# Patient Record
Sex: Female | Born: 1960 | Race: Black or African American | Hispanic: No | Marital: Single | Smoking: Never smoker
Health system: Southern US, Community
[De-identification: ages and names within clinical notes are randomized; demographics above are authoritative.]

## PROBLEM LIST (undated history)

## (undated) DIAGNOSIS — E119 Type 2 diabetes mellitus without complications: Secondary | ICD-10-CM

## (undated) DIAGNOSIS — I509 Heart failure, unspecified: Secondary | ICD-10-CM

## (undated) DIAGNOSIS — I639 Cerebral infarction, unspecified: Secondary | ICD-10-CM

## (undated) DIAGNOSIS — R569 Unspecified convulsions: Secondary | ICD-10-CM

## (undated) HISTORY — PX: NO PAST SURGERIES: SHX2092

---

## 2017-07-24 DIAGNOSIS — E119 Type 2 diabetes mellitus without complications: Secondary | ICD-10-CM

## 2017-07-24 HISTORY — DX: Type 2 diabetes mellitus without complications: E11.9

## 2017-08-17 ENCOUNTER — Encounter (HOSPITAL_COMMUNITY): Payer: Self-pay | Admitting: Emergency Medicine

## 2017-08-17 ENCOUNTER — Inpatient Hospital Stay (HOSPITAL_COMMUNITY)
Admission: EM | Admit: 2017-08-17 | Discharge: 2017-08-21 | DRG: 100 | Disposition: A | Discharge status: Home Health | Payer: 59 | Attending: Internal Medicine | Admitting: Internal Medicine

## 2017-08-17 DIAGNOSIS — G40901 Epilepsy, unspecified, not intractable, with status epilepticus: Principal | ICD-10-CM | POA: Diagnosis present

## 2017-08-17 DIAGNOSIS — Z7901 Long term (current) use of anticoagulants: Secondary | ICD-10-CM

## 2017-08-17 DIAGNOSIS — E1110 Type 2 diabetes mellitus with ketoacidosis without coma: Secondary | ICD-10-CM | POA: Diagnosis present

## 2017-08-17 DIAGNOSIS — E119 Type 2 diabetes mellitus without complications: Secondary | ICD-10-CM

## 2017-08-17 DIAGNOSIS — Z79899 Other long term (current) drug therapy: Secondary | ICD-10-CM

## 2017-08-17 DIAGNOSIS — Z8673 Personal history of transient ischemic attack (TIA), and cerebral infarction without residual deficits: Secondary | ICD-10-CM

## 2017-08-17 DIAGNOSIS — E1010 Type 1 diabetes mellitus with ketoacidosis without coma: Secondary | ICD-10-CM

## 2017-08-17 DIAGNOSIS — R4182 Altered mental status, unspecified: Secondary | ICD-10-CM | POA: Diagnosis not present

## 2017-08-17 DIAGNOSIS — G9389 Other specified disorders of brain: Secondary | ICD-10-CM | POA: Diagnosis present

## 2017-08-17 DIAGNOSIS — R569 Unspecified convulsions: Secondary | ICD-10-CM

## 2017-08-17 DIAGNOSIS — I4891 Unspecified atrial fibrillation: Secondary | ICD-10-CM | POA: Diagnosis present

## 2017-08-17 DIAGNOSIS — I509 Heart failure, unspecified: Secondary | ICD-10-CM | POA: Diagnosis present

## 2017-08-17 HISTORY — DX: Cerebral infarction, unspecified: I63.9

## 2017-08-17 HISTORY — DX: Unspecified convulsions: R56.9

## 2017-08-17 HISTORY — DX: Type 2 diabetes mellitus without complications: E11.9

## 2017-08-17 HISTORY — DX: Heart failure, unspecified: I50.9

## 2017-08-17 LAB — CBG MONITORING, ED: GLUCOSE-CAPILLARY: 497 mg/dL — AB (ref 65–99)

## 2017-08-17 MED ORDER — LORAZEPAM 2 MG/ML IJ SOLN
INTRAMUSCULAR | Status: AC
  Administered 2017-08-18: 2 mg
  Filled 2017-08-17: qty 1

## 2017-08-17 NOTE — ED Provider Notes (Signed)
[facility] EMERGENCY DEPARTMENT Provider Note   CSN: [id number] Arrival date & time: 08/17/17  2327     History   Chief Complaint Chief Complaint  Patient presents with  . Seizures   Level 5 caveat due to altered mental status HPI Denise Mcbride is a 57 y.o. female.  The history is provided by the patient and a relative. The history is limited by the condition of the patient.  Seizures   This is a new problem. Episode onset: Just prior to arrival. There were 2 to 3 seizures. The most recent episode lasted more than 5 minutes. The episode was witnessed. There has been no fever.  Patient presents for seizure.  She does not have a known history of seizures.  Patient was sitting in a chair and her family noticed that she began to have a generalized seizure. They did not give her medications Patient was found to be hyperglycemic. Patient is accompanied by her cousin.  They are traveling here from out of town.  History is limited from patient, his cousin reports she has difficulty speaking due to previous stroke.  She also appears to have postictal confusion  Patient has traveled here from [place] to see family  Past Medical History:  Diagnosis Date  . CHF (congestive heart failure) (HCC)   . Stroke (HCC)       OB History   None      Home Medications    Prior to Admission medications   Not on File    Family History No family history on file.  Social History Social History   Tobacco Use  . Smoking status: Never Smoker  . Smokeless tobacco: Never Used  Substance Use Topics  . Alcohol use: Never    Frequency: Never  . Drug use: Never     Allergies   Patient has no known allergies.   Review of Systems Review of Systems  Unable to perform ROS: Mental status change  Neurological: Positive for seizures.     Physical Exam Updated Vital Signs BP (!) 166/89 (BP Location: Right Arm)   Pulse (!) 108   Temp 98.1 F (36.7 C)    Resp 16   SpO2 99%   Physical Exam CONSTITUTIONAL: Well developed/well nourished HEAD: Normocephalic/atraumatic EYES: EOMI/PERRL ENMT: Mucous membranes moist NECK: supple no meningeal signs SPINE/BACK:entire spine nontender CV: S1/S2 noted, no murmurs/rubs/gallops noted LUNGS: Lungs are clear to auscultation bilaterally, no apparent distress ABDOMEN: soft, nontender GU:no cva tenderness NEURO: Pt is awake/alert, appears confused.  No obvious facial droop, no arm or leg drift EXTREMITIES: pulses normal/equal, full ROM SKIN: warm, color normal PSYCH: Unable to assess  ED Treatments / Results  Labs (all labs ordered are listed, but only abnormal results are displayed) Labs Reviewed  PROTIME-INR - Abnormal; Notable for the following components:      Result Value   Prothrombin Time 15.6 (*)    All other components within normal limits  CBC - Abnormal; Notable for the following components:   RBC 5.54 (*)    Hemoglobin 15.9 (*)    All other components within normal limits  COMPREHENSIVE METABOLIC PANEL - Abnormal; Notable for the following components:   Sodium 131 (*)    Chloride 95 (*)    CO2 18 (*)    Glucose, Bld 533 (*)    AST 51 (*)    Alkaline Phosphatase 151 (*)    Total Bilirubin 1.6 (*)    Anion gap 18 (*)  All other components within normal limits  URINALYSIS, ROUTINE W REFLEX MICROSCOPIC - Abnormal; Notable for the following components:   Color, Urine STRAW (*)    Glucose, UA >=500 (*)    Hgb urine dipstick SMALL (*)    Protein, ur 100 (*)    All other components within normal limits  CBG MONITORING, ED - Abnormal; Notable for the following components:   Glucose-Capillary 497 (*)    All other components within normal limits  CBG MONITORING, ED - Abnormal; Notable for the following components:   Glucose-Capillary 421 (*)    All other components within normal limits  ETHANOL  APTT  DIFFERENTIAL  RAPID URINE DRUG SCREEN, HOSP PERFORMED  I-STAT TROPONIN, ED      EKG EKG Interpretation  Date/Time:  Saturday Aug 17 2017 23:46:03 EDT Ventricular Rate:  101 PR Interval:    QRS Duration: 85 QT Interval:  381 QTC Calculation: 494 R Axis:   48 Text Interpretation:  Atrial fibrillation RSR' in V1 or V2, probably normal variant LVH with secondary repolarization abnormality Borderline prolonged QT interval No previous ECGs available Abnormal ekg Confirmed by Zadie Rhine ([id number]) on 08/17/2017 11:57:06 PM   Radiology Ct Head Wo Contrast  Result Date: 08/18/2017 CLINICAL DATA:  Seizure EXAM: CT HEAD WITHOUT CONTRAST TECHNIQUE: Contiguous axial images were obtained from the base of the skull through the vertex without intravenous contrast. COMPARISON:  None. FINDINGS: Brain: No acute territorial infarction, hemorrhage or intracranial mass. The ventricles are nonenlarged. Left frontal lobe encephalomalacia. Encephalomalacia at the insula Vascular: No hyperdense vessels. Scattered calcification at the carotid siphon Skull: Normal. Negative for fracture or focal lesion. Sinuses/Orbits: No acute finding. Other: None IMPRESSION: 1. No CT evidence for acute intracranial abnormality. 2. Left frontal lobe and insular encephalomalacia. Electronically Signed   By: Jasmine Pang M.D.   On: 08/18/2017 00:41    Procedures .Critical Care Performed by: Zadie Rhine, MD Authorized by: Zadie Rhine, MD   Critical care provider statement:    Critical care time (minutes):  80   Critical care start time:  08/18/2017 12:10 AM   Critical care end time:  08/18/2017 1:30 AM   Critical care was necessary to treat or prevent imminent or life-threatening deterioration of the following conditions:  CNS failure or compromise   Critical care was time spent personally by me on the following activities:  Development of treatment plan with patient or surrogate, discussions with consultants, evaluation of patient's response to treatment, examination of patient, obtaining  history from patient or surrogate, pulse oximetry, re-evaluation of patient's condition, ordering and review of radiographic studies, ordering and review of laboratory studies and ordering and performing treatments and interventions    Medications Ordered in ED Medications  insulin regular (NOVOLIN R,HUMULIN R) 100 Units in sodium chloride 0.9 % 100 mL (1 Units/mL) infusion (3.6 Units/hr Intravenous New Bag/Given 08/18/17 0131)  potassium chloride 10 mEq in 100 mL IVPB (10 mEq Intravenous New Bag/Given 08/18/17 0119)  LORazepam (ATIVAN) 2 MG/ML injection (2 mg  Given 08/18/17 0010)  levETIRAcetam (KEPPRA) IVPB 1000 mg/100 mL premix (0 mg Intravenous Stopped 08/18/17 0053)     Initial Impression / Assessment and Plan / ED Course  I have reviewed the triage vital signs and the nursing notes.  Pertinent labs & imaging results that were available during my care of the patient were reviewed by me and considered in my medical decision making (see chart for details).      This patients CHA2DS2-VASc Score and unadjusted  Ischemic Stroke Rate (% per year) is equal to 4.8 % stroke rate/year from a score of 4  Above score calculated as 1 point each if present [CHF, HTN, DM, Vascular=MI/PAD/Aortic Plaque, Age if 65-74, or Female] Above score calculated as 2 points each if present [Age > 75, or Stroke/TIA/TE]    12:00 AM This patient presents for new onset seizure.  She is now awake and alert but does appear confused.  History is limited as patient has postictal confusion, and she has no records here in [place] [facility] and labs pending at this time EKG does reveal atrial fibrillation, unknown time of onset and is unclear if patient knows she had this 12:10 AM  Called to room due to pt seizure It was generalized, per nursing, head was turned to the right Given ativan, now improving Keppra also ordered 1:50 AM Patient Is now resting comfortably, but easily arousable.  CT head results reviewed.   She is also found to be in mild DKA, but no reported history of diabetes Another family member at bedside confirms that she has no known history of seizures.  Problem list today includes new onset seizure, possibly new onset atrial fibrillation, and DKA. I discussed the case with Dr. Antionette Char with triad for admission I discussed the case with on-call neurologist.  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

## 2017-08-17 NOTE — ED Notes (Signed)
Patient transported to CT scan . 

## 2017-08-17 NOTE — ED Triage Notes (Signed)
Patient arrived with EMS seizure episode x2 this evening , CBG= 460 by EMS , family reported seizure while at a banquet this evening and at the ambulance , alert by confused at arrival .

## 2017-08-17 NOTE — ED Notes (Signed)
Checked CBG 497, RN Bobby informed

## 2017-08-18 ENCOUNTER — Inpatient Hospital Stay (HOSPITAL_COMMUNITY): Payer: 59

## 2017-08-18 ENCOUNTER — Encounter (HOSPITAL_COMMUNITY): Payer: Self-pay | Admitting: Family Medicine

## 2017-08-18 ENCOUNTER — Emergency Department (HOSPITAL_COMMUNITY): Payer: 59

## 2017-08-18 DIAGNOSIS — I509 Heart failure, unspecified: Secondary | ICD-10-CM

## 2017-08-18 DIAGNOSIS — Z8673 Personal history of transient ischemic attack (TIA), and cerebral infarction without residual deficits: Secondary | ICD-10-CM

## 2017-08-18 DIAGNOSIS — G9389 Other specified disorders of brain: Secondary | ICD-10-CM | POA: Diagnosis present

## 2017-08-18 DIAGNOSIS — Z7901 Long term (current) use of anticoagulants: Secondary | ICD-10-CM | POA: Diagnosis not present

## 2017-08-18 DIAGNOSIS — I4891 Unspecified atrial fibrillation: Secondary | ICD-10-CM | POA: Diagnosis present

## 2017-08-18 DIAGNOSIS — E1110 Type 2 diabetes mellitus with ketoacidosis without coma: Secondary | ICD-10-CM | POA: Diagnosis present

## 2017-08-18 DIAGNOSIS — E0811 Diabetes mellitus due to underlying condition with ketoacidosis with coma: Secondary | ICD-10-CM

## 2017-08-18 DIAGNOSIS — R569 Unspecified convulsions: Secondary | ICD-10-CM | POA: Diagnosis not present

## 2017-08-18 DIAGNOSIS — E119 Type 2 diabetes mellitus without complications: Secondary | ICD-10-CM

## 2017-08-18 DIAGNOSIS — Z79899 Other long term (current) drug therapy: Secondary | ICD-10-CM | POA: Diagnosis not present

## 2017-08-18 DIAGNOSIS — R4182 Altered mental status, unspecified: Secondary | ICD-10-CM | POA: Diagnosis present

## 2017-08-18 DIAGNOSIS — G40901 Epilepsy, unspecified, not intractable, with status epilepticus: Secondary | ICD-10-CM | POA: Diagnosis present

## 2017-08-18 LAB — HIV ANTIBODY (ROUTINE TESTING W REFLEX): HIV SCREEN 4TH GENERATION: NONREACTIVE

## 2017-08-18 LAB — URINALYSIS, ROUTINE W REFLEX MICROSCOPIC
BACTERIA UA: NONE SEEN
Bilirubin Urine: NEGATIVE
Glucose, UA: >=500 mg/dL — AB
Ketones, ur: NEGATIVE mg/dL
Leukocytes, UA: NEGATIVE
NITRITE: NEGATIVE
PH: 6 (ref 5.0–8.0)
Protein, ur: 100 mg/dL — AB
SPECIFIC GRAVITY, URINE: 1.024 (ref 1.005–1.030)

## 2017-08-18 LAB — CBC
HCT: 45.8 % (ref 36.0–46.0)
Hemoglobin: 15.9 g/dL — ABNORMAL HIGH (ref 12.0–15.0)
MCH: 28.7 pg (ref 26.0–34.0)
MCHC: 34.7 g/dL (ref 30.0–36.0)
MCV: 82.7 fL (ref 78.0–100.0)
PLATELETS: 182 10*3/uL (ref 150–400)
RBC: 5.54 MIL/uL — ABNORMAL HIGH (ref 3.87–5.11)
RDW: 12.2 % (ref 11.5–15.5)
WBC: 6 10*3/uL (ref 4.0–10.5)

## 2017-08-18 LAB — BASIC METABOLIC PANEL
ANION GAP: 8 (ref 5–15)
ANION GAP: 11 (ref 5–15)
ANION GAP: 12 (ref 5–15)
BUN: 9 mg/dL (ref 6–20)
BUN: 5 mg/dL — ABNORMAL LOW (ref 6–20)
BUN: 6 mg/dL (ref 6–20)
CALCIUM: 9.2 mg/dL (ref 8.9–10.3)
CALCIUM: 9.2 mg/dL (ref 8.9–10.3)
CALCIUM: 9 mg/dL (ref 8.9–10.3)
CO2: 24 mmol/L (ref 22–32)
CO2: 23 mmol/L (ref 22–32)
CO2: 20 mmol/L — ABNORMAL LOW (ref 22–32)
Chloride: 104 mmol/L (ref 101–111)
Chloride: 104 mmol/L (ref 101–111)
Chloride: 107 mmol/L (ref 101–111)
Creatinine, Ser: 0.59 mg/dL (ref 0.44–1.00)
Creatinine, Ser: 0.58 mg/dL (ref 0.44–1.00)
Creatinine, Ser: 0.59 mg/dL (ref 0.44–1.00)
GFR calc Af Amer: >60 mL/min (ref >60)
GFR calc Af Amer: >60 mL/min (ref >60)
GLUCOSE: 294 mg/dL — AB (ref 65–99)
Glucose, Bld: 245 mg/dL — ABNORMAL HIGH (ref 65–99)
Glucose, Bld: 213 mg/dL — ABNORMAL HIGH (ref 65–99)
Potassium: 3.2 mmol/L — ABNORMAL LOW (ref 3.5–5.1)
Potassium: 3 mmol/L — ABNORMAL LOW (ref 3.5–5.1)
Potassium: 3.7 mmol/L (ref 3.5–5.1)
SODIUM: 138 mmol/L (ref 135–145)
SODIUM: 139 mmol/L (ref 135–145)
Sodium: 136 mmol/L (ref 135–145)

## 2017-08-18 LAB — COMPREHENSIVE METABOLIC PANEL
ALBUMIN: 4.5 g/dL (ref 3.5–5.0)
ALK PHOS: 151 U/L — AB (ref 38–126)
ALT: 25 U/L (ref 14–54)
AST: 51 U/L — AB (ref 15–41)
Anion gap: 18 — ABNORMAL HIGH (ref 5–15)
BILIRUBIN TOTAL: 1.6 mg/dL — AB (ref 0.3–1.2)
BUN: 9 mg/dL (ref 6–20)
CALCIUM: 9.8 mg/dL (ref 8.9–10.3)
CO2: 18 mmol/L — AB (ref 22–32)
CREATININE: 0.88 mg/dL (ref 0.44–1.00)
Chloride: 95 mmol/L — ABNORMAL LOW (ref 101–111)
GFR calc Af Amer: >60 mL/min (ref >60)
GFR calc non Af Amer: >60 mL/min (ref >60)
GLUCOSE: 533 mg/dL — AB (ref 65–99)
Potassium: 3.6 mmol/L (ref 3.5–5.1)
SODIUM: 131 mmol/L — AB (ref 135–145)
Total Protein: 8 g/dL (ref 6.5–8.1)

## 2017-08-18 LAB — RAPID URINE DRUG SCREEN, HOSP PERFORMED
AMPHETAMINES: NOT DETECTED
BARBITURATES: NOT DETECTED
Benzodiazepines: NOT DETECTED
Cocaine: NOT DETECTED
OPIATES: NOT DETECTED
TETRAHYDROCANNABINOL: NOT DETECTED

## 2017-08-18 LAB — CBG MONITORING, ED
GLUCOSE-CAPILLARY: 256 mg/dL — AB (ref 65–99)
GLUCOSE-CAPILLARY: 90 mg/dL (ref 65–99)
GLUCOSE-CAPILLARY: 168 mg/dL — AB (ref 65–99)
GLUCOSE-CAPILLARY: 205 mg/dL — AB (ref 65–99)
Glucose-Capillary: 421 mg/dL — ABNORMAL HIGH (ref 65–99)
Glucose-Capillary: 319 mg/dL — ABNORMAL HIGH (ref 65–99)
Glucose-Capillary: 231 mg/dL — ABNORMAL HIGH (ref 65–99)
Glucose-Capillary: 129 mg/dL — ABNORMAL HIGH (ref 65–99)
Glucose-Capillary: 116 mg/dL — ABNORMAL HIGH (ref 65–99)
Glucose-Capillary: 230 mg/dL — ABNORMAL HIGH (ref 65–99)

## 2017-08-18 LAB — I-STAT TROPONIN, ED: Troponin i, poc: 0.02 ng/mL (ref 0.00–0.08)

## 2017-08-18 LAB — GLUCOSE, CAPILLARY
GLUCOSE-CAPILLARY: 115 mg/dL — AB (ref 65–99)
Glucose-Capillary: 266 mg/dL — ABNORMAL HIGH (ref 65–99)

## 2017-08-18 LAB — ETHANOL: Alcohol, Ethyl (B): <10 mg/dL (ref <10)

## 2017-08-18 LAB — PROTIME-INR
INR: 1.25
Prothrombin Time: 15.6 seconds — ABNORMAL HIGH (ref 11.4–15.2)

## 2017-08-18 LAB — HEMOGLOBIN A1C
Hgb A1c MFr Bld: 13.9 % — ABNORMAL HIGH (ref 4.8–5.6)
MEAN PLASMA GLUCOSE: 352.23 mg/dL

## 2017-08-18 LAB — DIFFERENTIAL
Abs Immature Granulocytes: 0 10*3/uL (ref 0.0–0.1)
BASOS ABS: 0 10*3/uL (ref 0.0–0.1)
BASOS PCT: 1 %
EOS ABS: 0.1 10*3/uL (ref 0.0–0.7)
EOS PCT: 2 %
IMMATURE GRANULOCYTES: 1 %
LYMPHS ABS: 2.2 10*3/uL (ref 0.7–4.0)
Lymphocytes Relative: 36 %
Monocytes Absolute: 0.6 10*3/uL (ref 0.1–1.0)
Monocytes Relative: 10 %
NEUTROS PCT: 50 %
Neutro Abs: 3.1 10*3/uL (ref 1.7–7.7)

## 2017-08-18 LAB — MAGNESIUM: Magnesium: 1.8 mg/dL (ref 1.7–2.4)

## 2017-08-18 LAB — APTT: aPTT: 29 seconds (ref 24–36)

## 2017-08-18 MED ORDER — FAMOTIDINE 20 MG PO TABS: 20.0000 mg | ORAL_TABLET | Freq: Two times a day (BID) | ORAL | Status: DC

## 2017-08-18 MED ORDER — CARVEDILOL 6.25 MG PO TABS
6.2500 mg | ORAL_TABLET | Freq: Two times a day (BID) | ORAL | Status: DC
  Administered 2017-08-18 – 2017-08-21 (×7): 6.25 mg via ORAL
  Filled 2017-08-18 (×7): qty 1

## 2017-08-18 MED ORDER — LEVETIRACETAM 500 MG PO TABS
500.0000 mg | ORAL_TABLET | Freq: Two times a day (BID) | ORAL | Status: DC
  Administered 2017-08-18 – 2017-08-21 (×6): 500 mg via ORAL
  Filled 2017-08-18 (×6): qty 1

## 2017-08-18 MED ORDER — LORAZEPAM 2 MG/ML IJ SOLN: 1.0000 mg | Freq: Four times a day (QID) | INTRAMUSCULAR | Status: DC | PRN

## 2017-08-18 MED ORDER — SODIUM CHLORIDE 0.9 % IV SOLN
INTRAVENOUS | Status: DC
  Administered 2017-08-18: 3.6 [IU]/h via INTRAVENOUS
  Filled 2017-08-18: qty 1

## 2017-08-18 MED ORDER — POTASSIUM CHLORIDE 10 MEQ/100ML IV SOLN
10.0000 meq | INTRAVENOUS | Status: AC
  Administered 2017-08-18 (×2): 10 meq via INTRAVENOUS
  Filled 2017-08-18: qty 100

## 2017-08-18 MED ORDER — FAMOTIDINE IN NACL 20-0.9 MG/50ML-% IV SOLN
20.0000 mg | Freq: Two times a day (BID) | INTRAVENOUS | Status: DC
  Administered 2017-08-18 – 2017-08-19 (×3): 20 mg via INTRAVENOUS
  Filled 2017-08-18 (×4): qty 50

## 2017-08-18 MED ORDER — POTASSIUM CHLORIDE 10 MEQ/100ML IV SOLN: 10.0000 meq | INTRAVENOUS | Status: DC

## 2017-08-18 MED ORDER — DEXTROSE-NACL 5-0.45 % IV SOLN
INTRAVENOUS | Status: DC
  Administered 2017-08-18: 05:00:00 via INTRAVENOUS

## 2017-08-18 MED ORDER — SODIUM CHLORIDE 0.9 % IV SOLN
INTRAVENOUS | Status: DC
  Administered 2017-08-18: 3.9 [IU]/h via INTRAVENOUS

## 2017-08-18 MED ORDER — LEVETIRACETAM IN NACL 1000 MG/100ML IV SOLN
1000.0000 mg | Freq: Once | INTRAVENOUS | Status: AC
  Administered 2017-08-18: 1000 mg via INTRAVENOUS
  Filled 2017-08-18: qty 100

## 2017-08-18 MED ORDER — ATORVASTATIN CALCIUM 80 MG PO TABS
80.0000 mg | ORAL_TABLET | Freq: Every day | ORAL | Status: DC
  Administered 2017-08-18 – 2017-08-20 (×3): 80 mg via ORAL
  Filled 2017-08-18: qty 2
  Filled 2017-08-18 (×3): qty 1

## 2017-08-18 MED ORDER — INSULIN GLARGINE 100 UNIT/ML ~~LOC~~ SOLN
10.0000 [IU] | Freq: Once | SUBCUTANEOUS | Status: AC
  Administered 2017-08-18: 10 [IU] via SUBCUTANEOUS
  Filled 2017-08-18: qty 0.1

## 2017-08-18 MED ORDER — SODIUM CHLORIDE 0.9 % IV SOLN
INTRAVENOUS | Status: DC
  Administered 2017-08-18: 05:00:00 via INTRAVENOUS

## 2017-08-18 MED ORDER — APIXABAN 5 MG PO TABS
5.0000 mg | ORAL_TABLET | Freq: Two times a day (BID) | ORAL | Status: DC
Start: 2017-08-18 — End: 2017-08-21
  Administered 2017-08-18 – 2017-08-21 (×6): 5 mg via ORAL
  Filled 2017-08-18 (×8): qty 1

## 2017-08-18 MED ORDER — LEVETIRACETAM IN NACL 500 MG/100ML IV SOLN
500.0000 mg | Freq: Two times a day (BID) | INTRAVENOUS | Status: DC
  Administered 2017-08-18: 500 mg via INTRAVENOUS
  Filled 2017-08-18 (×2): qty 100

## 2017-08-18 MED ORDER — INSULIN ASPART 100 UNIT/ML ~~LOC~~ SOLN
0.0000 [IU] | Freq: Three times a day (TID) | SUBCUTANEOUS | Status: DC
  Administered 2017-08-18 – 2017-08-19 (×2): 8 [IU] via SUBCUTANEOUS
  Administered 2017-08-19 (×2): 3 [IU] via SUBCUTANEOUS
  Administered 2017-08-20: 2 [IU] via SUBCUTANEOUS
  Administered 2017-08-20: 3 [IU] via SUBCUTANEOUS
  Administered 2017-08-20: 2 [IU] via SUBCUTANEOUS
  Administered 2017-08-21: 3 [IU] via SUBCUTANEOUS

## 2017-08-18 NOTE — ED Notes (Signed)
Patient transported to MRI 

## 2017-08-18 NOTE — ED Notes (Signed)
Checked CBG 129, RN Bobby informed

## 2017-08-18 NOTE — Progress Notes (Signed)
Patient arrived from ED; oriented to room and unit routine; high fall risk; bed alarm set and bed low; reviewed fall safety. Visitors at bedside.

## 2017-08-18 NOTE — Procedures (Signed)
Date of recording 08/18/2017  Referring physician Dr.Opyd  Technical Digital EEG recording using 10-20 International electrode system.  Reason for the study Seizures  Description of the recording Posterior dominant rhythm is 8-9 Hz symmetrical and reactive Epileptiform features were not seen during this recording  Impression The EEG is normal in awake and drowsy states

## 2017-08-18 NOTE — ED Notes (Signed)
Attempted report 

## 2017-08-18 NOTE — ED Notes (Signed)
EDP notified on pt.'s hyperglycemia .

## 2017-08-18 NOTE — ED Notes (Signed)
EEG at bedside.

## 2017-08-18 NOTE — Progress Notes (Addendum)
57 year old female admitted early this morning with new onset seizures and hyperglycemia.  Patient has a history of stroke, congestive heart failure and he is on anticoagulation with Eliquis for possible atrial fibrillation.  She is from out of [place] that she is here for a family reunion.  Had a seizure at home and had a seizure while she was in the ambulance with a EMS.  Neurology consulted.  Patient still not awake and alert and up to eat.  We will continue D5 normal  for now.  Her blood sugars are less than 100.  Insulin drip has been DC'd.  Her potassium is 3.2 replete potassium.  Magnesium is 1.8 replete magnesium.  MRI of the brain done today showed remote encephalomalacia involving the left frontal operculum and insular cortex measuring 2.3 x 1.8 x 3.4 cm could certainly serve as a seizure focus.  Continue Vimpat 100 twice daily for now.

## 2017-08-18 NOTE — ED Notes (Signed)
Pt sitting up speaking with family.  Family states pt back to norm mentally, except that she is acting very sleepy.

## 2017-08-18 NOTE — ED Notes (Signed)
Pt to mri 

## 2017-08-18 NOTE — ED Notes (Signed)
1st run of Kcl IV and Insulin drip infusing , IV sites intact , pt. Sleeping with no distress/respirationsd unlabored .Purewick external urinary cath intact .

## 2017-08-18 NOTE — H&P (Signed)
History and Physical    Denise Mcbride ZOX:[id number] DOB: 17-Apr-1960 DOA: 08/17/2017  PCP: Patient, No Pcp Per   Patient coming from: Home   Chief Complaint: Seizures   HPI: Denise Mcbride is a 57 y.o. female with medical history significant for history of stroke, chronic CHF, and anticoagulation with Eliquis, now presenting to the emergency department for evaluation of seizures.  Patient is unable to contribute to the history due to her clinical condition.  She is from out of state, here for a family reunion, seem to be doing well, but was then noted to have seizure-like activity and EMS was called.  She had a second seizure in the presence of EMS that was self-limited.  Family members do not believe that she has any history of seizures.  ED Course: Upon arrival to the ED, patient is found to be afebrile, saturating well on room air, tachypneic, mildly tachycardic, and with stable blood pressure.  EKG features atrial fibrillation with LVH and repolarization abnormality.  Noncontrast head CT is negative for acute intracranial abnormality.  Chemistry panel is notable for serum glucose of 533, sodium 131, bicarbonate 18, anion gap 18, and slight elevations in alkaline phosphatase, AST, and total bilirubin.  CBC is unremarkable, troponin is normal, UDS negative, and urinalysis notable for glucosuria, but not indicative of infection.  Patient had another generalized seizure in the emergency department that lasted roughly 30 seconds.  She was loaded with 1 g of IV Keppra, started on insulin infusion, treated with 20 mEq of IV potassium, and neurology was consulted by the ED physician.  Patient remains hemodynamically stable, but is obtunded and will be admitted for ongoing evaluation and management of new seizures with mild/early diabetic ketoacidosis.  Review of Systems:  Unable to complete ROS secondary to the patient's clinical condition.  Past Medical History:  Diagnosis Date  . CHF (congestive  heart failure) (HCC)   . Stroke [facility])     History reviewed. No pertinent surgical history.   reports that she has never smoked. She has never used smokeless tobacco. She reports that she does not drink alcohol or use drugs.  No Known Allergies  History reviewed. No pertinent family history.   Prior to Admission medications   Medication Sig Start Date End Date Taking? Authorizing Provider  amLODipine (NORVASC) 10 MG tablet Take 10 mg by mouth every morning. 07/04/17  Yes [provider]  atorvastatin (LIPITOR) 80 MG tablet Take 80 mg by mouth every evening. 09/07/16  Yes [provider]  carvedilol (COREG) 6.25 MG tablet Take 6.25 mg by mouth 2 (two) times daily. 08/07/17  Yes [provider]  ELIQUIS 5 MG TABS tablet Take 5 mg by mouth 2 (two) times daily. 07/29/17  Yes [provider]  famotidine (PEPCID) 20 MG tablet Take 20 mg by mouth 2 (two) times daily. 08/11/17  Yes [provider]  furosemide (LASIX) 20 MG tablet Take 20 mg by mouth every morning. 03/04/17  Yes [provider]  potassium chloride (KLOR-CON M10) 10 MEQ tablet Take 10 mEq by mouth every morning. 12/13/16  Yes [provider]    Physical Exam: Vitals:   08/18/17 0315 08/18/17 0330 08/18/17 0345 08/18/17 0400  BP: 121/80 126/72 119/76 124/81  Pulse: 68 78 63 65  Resp: Temp:      TempSrc:      SpO2:    100%      Constitutional: No apparent distress, obtunded Eyes: PERTLA, lids and  conjunctivae normal ENMT: Mucous membranes are moist. Posterior pharynx clear of any exudate or lesions.   Neck: normal, supple, no masses, no thyromegaly Respiratory: clear to auscultation bilaterally, no wheezing, no crackles. Normal respiratory effort.   Cardiovascular: Rate ~100 and regular. No extremity edema. No significant JVD. Abdomen: No distension, no tenderness, soft. Bowel sounds normal.  Musculoskeletal: no clubbing / cyanosis. No joint deformity  upper and lower extremities.   Skin: no significant rashes, lesions, ulcers. Warm, dry, well-perfused. Neurologic: No gross facial asymmetry. Patellar DTR intact. Babinski response downgoing bilaterally. Obtunded.    Labs on Admission: I have personally reviewed following labs and imaging studies  CBC: Recent Labs  Lab 08/17/17 2351  WBC 6.0  NEUTROABS 3.1  HGB 15.9*  HCT 45.8  MCV 82.7  PLT 182   Basic Metabolic Panel: Recent Labs  Lab 08/17/17 2351  NA 131*  K 3.6  CL 95*  CO2 18*  GLUCOSE 533*  BUN 9  CREATININE 0.88  CALCIUM 9.8   GFR: CrCl cannot be calculated (Unknown ideal weight.). Liver Function Tests: Recent Labs  Lab 08/17/17 2351  AST 51*  ALT 25  ALKPHOS 151*  BILITOT 1.6*  PROT 8.0  ALBUMIN 4.5   No results for input(s): LIPASE, AMYLASE in the last 168 hours. No results for input(s): AMMONIA in the last 168 hours. Coagulation Profile: Recent Labs  Lab 08/17/17 2351  INR 1.25   Cardiac Enzymes: No results for input(s): CKTOTAL, CKMB, CKMBINDEX, TROPONINI in the last 168 hours. BNP (last 3 results) No results for input(s): PROBNP in the last 8760 hours. HbA1C: No results for input(s): HGBA1C in the last 72 hours. CBG: Recent Labs  Lab 08/17/17 2343 08/18/17 0126 08/18/17 0237 08/18/17 0401  GLUCAP 497* 421* 319* 256*   Lipid Profile: No results for input(s): CHOL, HDL, LDLCALC, TRIG, CHOLHDL, LDLDIRECT in the last 72 hours. Thyroid Function Tests: No results for input(s): TSH, T4TOTAL, FREET4, T3FREE, THYROIDAB in the last 72 hours. Anemia Panel: No results for input(s): VITAMINB12, FOLATE, FERRITIN, TIBC, IRON, RETICCTPCT in the last 72 hours. Urine analysis:    Component Value Date/Time   COLORURINE STRAW (A) 08/18/2017 0109   APPEARANCEUR CLEAR 08/18/2017 0109   LABSPEC 1.024 08/18/2017 0109   PHURINE 6.0 08/18/2017 0109   GLUCOSEU >=500 (A) 08/18/2017 0109   HGBUR SMALL (A) 08/18/2017 0109   BILIRUBINUR NEGATIVE  08/18/2017 0109   KETONESUR NEGATIVE 08/18/2017 0109   PROTEINUR 100 (A) 08/18/2017 0109   NITRITE NEGATIVE 08/18/2017 0109   LEUKOCYTESUR NEGATIVE 08/18/2017 0109   Sepsis Labs: (procalcitonin:4,lacticidven:4) )No results found for this or any previous visit (from the past 240 hour(s)).   Radiological Exams on Admission: Ct Head Wo Contrast  Result Date: 08/18/2017 CLINICAL DATA:  Seizure EXAM: CT HEAD WITHOUT CONTRAST TECHNIQUE: Contiguous axial images were obtained from the base of the skull through the vertex without intravenous contrast. COMPARISON:  None. FINDINGS: Brain: No acute territorial infarction, hemorrhage or intracranial mass. The ventricles are nonenlarged. Left frontal lobe encephalomalacia. Encephalomalacia at the insula Vascular: No hyperdense vessels. Scattered calcification at the carotid siphon Skull: Normal. Negative for fracture or focal lesion. Sinuses/Orbits: No acute finding. Other: None IMPRESSION: 1. No CT evidence for acute intracranial abnormality. 2. Left frontal lobe and insular encephalomalacia. Electronically Signed   By: Jasmine Pang M.D.   On: 08/18/2017 00:41    EKG: Independently reviewed. Atrial fibrillation, LVH with repolarization abnormalities.   Assessment/Plan  1. Seizures   - Presents after a seizure,  had another en route, and a 3rd in ED that was witnessed and generalized with gaze fixed to right and followed by somnolence  - Head CT is negative for acute findings  - Loaded with 1 g IV Keppra in ED  - Neurology is consulting and much appreciated  - MRI brain and EEG ordered, continue neuro checks and seizure precautions, prn Ativan   2. DKA  - Presents with new seizures and found to have serum glucose 533 with mild acidosis, serum ketones pending  - No known hx of DM  - Started on insulin infusion in ED  - Continue insulin infusion with frequent CBG's and serial chem panels   3. Atrial fibrillation  - Presents in a fib with  rates low 100's  - Suspect this is not new as she is on Eliquis  - CHADS-VASc at least 4 (gender, CHF, CVA x2)  - Continue Eliquis and beta-blocker    4. Chronic CHF  - No echo report on file to further characterize  - Appears well-compensated   - Follow daily wt and I/O's, hold Lasix initially, continue Coreg as tolerated   5. Hx of CVA - Presents with seizures - Head CT negative for acute findings  - MRI pending  - Continue Lipitor and Eliquis    DVT prophylaxis: Eliquis  Code Status: Full  Family Communication: Discussed with patient Consults called: Neurology Admission status: Inpatient   Briscoe Deutscher, MD [facility] Pager [phone]  If 7PM-7AM, please contact night-coverage [email or web address] Password [email or web address]  08/18/2017, 4:08 AM

## 2017-08-18 NOTE — Consult Note (Signed)
Reason for Consult: New onset seizure Referring Physician: [name] is an 57 y.o. female.  HPI:  Patient visiting family from out of town when she was witnessed to have a GTCS.  En route in the ambulance she had a second GTCS followed by a third GTCS after CT which was associated with right gaze deviation.  She received a total of 4 mg of Ativan.  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  No blood.  Apparently she never had seizures before.  She was loaded with Keppra 1000 mg IV.  In addition, she had glucose 533, Na 131, bicarb 18, and U/A with glucose.  CBC is normal.  She is not a known diabetic.  She is on insulin gtt 5 Units/hr.    Past Medical History:  Diagnosis Date  . CHF (congestive heart failure) ([place])   . Stroke [facility])     History reviewed. No pertinent surgical history.  History reviewed. No pertinent family history.  Social History:  reports that she has never smoked. She has never used smokeless tobacco. She reports that she does not drink alcohol or use drugs.  Allergies: No Known Allergies  Prior to Admission medications   Medication Sig Start Date End Date Taking? Authorizing Provider  amLODipine (NORVASC) 10 MG tablet Take 10 mg by mouth every morning. 07/04/17  Yes [provider]  atorvastatin (LIPITOR) 80 MG tablet Take 80 mg by mouth every evening. 09/07/16  Yes [provider]  carvedilol (COREG) 6.25 MG tablet Take 6.25 mg by mouth 2 (two) times daily. 08/07/17  Yes [provider]  ELIQUIS 5 MG TABS tablet Take 5 mg by mouth 2 (two) times daily. 07/29/17  Yes [provider]  famotidine (PEPCID) 20 MG tablet Take 20 mg by mouth 2 (two) times daily. 08/11/17  Yes [provider]  furosemide (LASIX) 20 MG tablet Take 20 mg by mouth every morning. 03/04/17  Yes [provider]  potassium chloride (KLOR-CON M10) 10 MEQ tablet Take 10 mEq by mouth every morning. 12/13/16  Yes  [provider]    Medications:  Prior to Admission:  (Not in a hospital admission) Scheduled: . apixaban  5 mg Oral BID  . atorvastatin  80 mg Oral q1800  . carvedilol  6.25 mg Oral BID WC    Results for orders placed or performed during the hospital encounter of 08/17/17 (from the past 48 hour(s))  CBG monitoring, ED     Status: Abnormal   Collection Time: 08/17/17 11:43 PM  Result Value Ref Range   Glucose-Capillary 497 (H) 65 - 99 mg/dL   Comment 1 Notify RN   Ethanol     Status: None   Collection Time: 08/17/17 11:51 PM  Result Value Ref Range   Alcohol, Ethyl (B) <10 <10 mg/dL    Comment: (NOTE) Lowest detectable limit for serum alcohol is 10 mg/dL. For medical purposes only. Performed at [facility], [place] [address] [place], [place] [address]   Protime-INR     Status: Abnormal   Collection Time: 08/17/17 11:51 PM  Result Value Ref Range   Prothrombin Time 15.6 (H) 11.4 - 15.2 seconds   INR 1.25     Comment: Performed at [facility] [address] [place],  [address]  APTT     Status: None   Collection Time: 08/17/17 11:51 PM  Result Value Ref Range   aPTT 29 24 -  36 seconds    Comment: Performed at [facility], [place] [address], [place], [place] [address]  CBC     Status: Abnormal   Collection Time: 08/17/17 11:51 PM  Result Value Ref Range   WBC 6.0 4.0 - 10.5 K/uL   RBC 5.54 (H) 3.87 - 5.11 MIL/uL   Hemoglobin 15.9 (H) 12.0 - 15.0 g/dL   HCT 45.8 36.0 - 46.0 %   MCV 82.7 78.0 - 100.0 fL   MCH 28.7 26.0 - 34.0 pg   MCHC 34.7 30.0 - 36.0 g/dL   RDW 12.2 11.5 - 15.5 %   Platelets 182 150 - 400 K/uL    Comment: Performed at [facility] [address] [place], [place] [address]  Differential     Status: None   Collection Time: 08/17/17 11:51 PM  Result Value Ref Range   Neutrophils Relative % 50 %   Neutro Abs 3.1 1.7 - 7.7 K/uL   Lymphocytes Relative 36 %   Lymphs Abs 2.2 0.7 - 4.0 K/uL   Monocytes Relative  10 %   Monocytes Absolute 0.6 0.1 - 1.0 K/uL   Eosinophils Relative 2 %   Eosinophils Absolute 0.1 0.0 - 0.7 K/uL   Basophils Relative 1 %   Basophils Absolute 0.0 0.0 - 0.1 K/uL   Immature Granulocytes 1 %   Abs Immature Granulocytes 0.0 0.0 - 0.1 K/uL    Comment: Performed at [facility] [address] [place], [place] [address]  Comprehensive metabolic panel     Status: Abnormal   Collection Time: 08/17/17 11:51 PM  Result Value Ref Range   Sodium 131 (L) 135 - 145 mmol/L   Potassium 3.6 3.5 - 5.1 mmol/L   Chloride 95 (L) 101 - 111 mmol/L   CO2 18 (L) 22 - 32 mmol/L   Glucose, Bld 533 (HH) 65 - 99 mg/dL    Comment: CRITICAL RESULT CALLED TO, READ BACK BY AND VERIFIED WITH: J.GIBSON,RN [id number] 08/18/17 M.CAMPBELL    BUN 9 6 - 20 mg/dL   Creatinine, Ser 0.88 0.44 - 1.00 mg/dL   Calcium 9.8 8.9 - 10.3 mg/dL   Total Protein 8.0 6.5 - 8.1 g/dL   Albumin 4.5 3.5 - 5.0 g/dL   AST 51 (H) 15 - 41 U/L   ALT 25 14 - 54 U/L   Alkaline Phosphatase 151 (H) 38 - 126 U/L   Total Bilirubin 1.6 (H) 0.3 - 1.2 mg/dL   GFR calc non Af Amer >60 >60 mL/min   GFR calc Af Amer >60 >60 mL/min    Comment: (NOTE) The eGFR has been calculated using the CKD EPI equation. This calculation has not been validated in all clinical situations. eGFR's persistently <60 mL/min signify possible Chronic Kidney Disease.    Anion gap 18 (H) 5 - 15    Comment: Performed at [facility], [place] [address], [place], [place] [address]  I-stat troponin, ED     Status: None   Collection Time: 08/17/17 11:54 PM  Result Value Ref Range   Troponin i, poc 0.02 0.00 - 0.08 ng/mL   Comment 3            Comment: Due to the release kinetics of cTnI, a negative result within the first hours of the onset of symptoms does not rule out myocardial infarction with certainty. If myocardial infarction is still suspected, repeat the test at appropriate intervals.   Urine rapid drug screen (hosp performed)  Status:  None   Collection Time: 08/18/17  1:09 AM  Result Value Ref Range   Opiates NONE DETECTED NONE DETECTED   Cocaine NONE DETECTED NONE DETECTED   Benzodiazepines NONE DETECTED NONE DETECTED   Amphetamines NONE DETECTED NONE DETECTED   Tetrahydrocannabinol NONE DETECTED NONE DETECTED   Barbiturates NONE DETECTED NONE DETECTED    Comment: (NOTE) DRUG SCREEN FOR MEDICAL PURPOSES ONLY.  IF CONFIRMATION IS NEEDED FOR ANY PURPOSE, NOTIFY LAB WITHIN 5 DAYS. LOWEST DETECTABLE LIMITS FOR URINE DRUG SCREEN Drug Class                     Cutoff (ng/mL) Amphetamine and metabolites    1000 Barbiturate and metabolites    200 Benzodiazepine                 250 Tricyclics and metabolites     300 Opiates and metabolites        300 Cocaine and metabolites        300 THC                            50 Performed at [facility], [place] [address], [place], [place] [address]   Urinalysis, Routine w reflex microscopic     Status: Abnormal   Collection Time: 08/18/17  1:09 AM  Result Value Ref Range   Color, Urine STRAW (A) YELLOW   APPearance CLEAR CLEAR   Specific Gravity, Urine 1.024 1.005 - 1.030   pH 6.0 5.0 - 8.0   Glucose, UA >=500 (A) NEGATIVE mg/dL   Hgb urine dipstick SMALL (A) NEGATIVE   Bilirubin Urine NEGATIVE NEGATIVE   Ketones, ur NEGATIVE NEGATIVE mg/dL   Protein, ur 100 (A) NEGATIVE mg/dL   Nitrite NEGATIVE NEGATIVE   Leukocytes, UA NEGATIVE NEGATIVE   RBC / HPF 0-5 0 - 5 RBC/hpf   WBC, UA 0-5 0 - 5 WBC/hpf   Bacteria, UA NONE SEEN NONE SEEN   Squamous Epithelial / LPF 0-5 0 - 5   Mucus PRESENT     Comment: Performed at [facility] [address], [place],  [address]  CBG monitoring, ED     Status: Abnormal   Collection Time: 08/18/17  1:26 AM  Result Value Ref Range   Glucose-Capillary 421 (H) 65 - 99 mg/dL  CBG monitoring, ED     Status: Abnormal   Collection Time: 08/18/17  2:37 AM  Result Value Ref Range   Glucose-Capillary 319 (H) 65 - 99 mg/dL   CBG monitoring, ED     Status: Abnormal   Collection Time: 08/18/17  4:01 AM  Result Value Ref Range   Glucose-Capillary 256 (H) 65 - 99 mg/dL  CBG monitoring, ED     Status: Abnormal   Collection Time: 08/18/17  5:01 AM  Result Value Ref Range   Glucose-Capillary 231 (H) 65 - 99 mg/dL    Ct Head Wo Contrast  Result Date: 08/18/2017 CLINICAL DATA:  Seizure EXAM: CT HEAD WITHOUT CONTRAST TECHNIQUE: Contiguous axial images were obtained from the base of the skull through the vertex without intravenous contrast. COMPARISON:  None. FINDINGS: Brain: No acute territorial infarction, hemorrhage or intracranial mass. The ventricles are nonenlarged. Left frontal lobe encephalomalacia. Encephalomalacia at the insula Vascular: No hyperdense vessels. Scattered calcification at the carotid siphon Skull: Normal. Negative for fracture or focal lesion. Sinuses/Orbits: No acute finding. Other: None IMPRESSION: 1. No CT evidence for  acute intracranial abnormality. 2. Left frontal lobe and insular encephalomalacia. Electronically Signed   By: Donavan Foil M.D.   On: 08/18/2017 00:41    ROS Blood pressure 116/84, pulse (!) 105, temperature 98.1 F (36.7 C), resp. rate 20, SpO2 100 %. Neurologic Examination:  Awakens, but lethargic and confused.   Will not follow some commands, but does others. PERL, EOMI. Face symmetric. Tongue midline with laceration on right. Moves all extremities equally. No babinski or hoffman's.  Coord- will not follow command.  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  She is now post-ictal and also under the influence of prior benzo and Keppra load.  I will add maintenance IV Keppra dosing.  EEG in am.  MRI is not likely to add a lot, but we can see if any new event which may have also triggered a seizure.  The diabetic ketoacidosis is also likely to have lowered the seizure threshold.    Denise Jury, MD 08/18/2017, 5:15 AM

## 2017-08-18 NOTE — ED Notes (Signed)
MD paged RE insulin drip.

## 2017-08-18 NOTE — Progress Notes (Signed)
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  Patient back to baseline.  EEG: The EEG is normal in awake and drowsy states  Plan:  Switch to oral Keppra  BID  Per [facility] statutes, patients with seizures are not allowed to drive until they have been seizure-free for six months. Use caution when using heavy equipment or power tools. Avoid working on ladders or at heights. Take showers instead of baths. Ensure the water temperature is not too high on the home water heater. Do not go swimming alone. Do not lock yourself in a room alone (i.e. bathroom). When caring for infants or small children, sit down when holding, feeding, or changing them to minimize risk of injury to the child in the event you have a seizure. Maintain good sleep hygiene. Avoid alcohol.    If Denise Mcbride has another seizure, call 911 and bring them back to the ED if:       A.  The seizure lasts longer than 5 minutes.            B.  The patient doesn't wake shortly after the seizure or has new problems such as difficulty seeing, speaking or moving following the seizure       C.  The patient was injured during the seizure       D.  The patient has a temperature over 102 F (39C)       E.  The patient vomited during the seizure and now is having trouble breathing

## 2017-08-18 NOTE — ED Notes (Signed)
Patient had a grand mal seizure after returning from CT scan approx. 30 sec . , EDP notified .

## 2017-08-18 NOTE — ED Notes (Signed)
MD paged for sliding scale insulin order.

## 2017-08-18 NOTE — ED Notes (Signed)
Soft, carb modified diet ordered.

## 2017-08-18 NOTE — Progress Notes (Signed)
EEG complete - results pending 

## 2017-08-19 LAB — GLUCOSE, CAPILLARY
GLUCOSE-CAPILLARY: 160 mg/dL — AB (ref 65–99)
GLUCOSE-CAPILLARY: 188 mg/dL — AB (ref 65–99)
GLUCOSE-CAPILLARY: 281 mg/dL — AB (ref 65–99)

## 2017-08-19 LAB — CBC
HCT: 40.6 % (ref 36.0–46.0)
HEMOGLOBIN: 14.4 g/dL (ref 12.0–15.0)
MCH: 28.6 pg (ref 26.0–34.0)
MCHC: 35.5 g/dL (ref 30.0–36.0)
MCV: 80.6 fL (ref 78.0–100.0)
Platelets: 162 10*3/uL (ref 150–400)
RBC: 5.04 MIL/uL (ref 3.87–5.11)
RDW: 12.4 % (ref 11.5–15.5)
WBC: 5 10*3/uL (ref 4.0–10.5)

## 2017-08-19 LAB — BASIC METABOLIC PANEL
ANION GAP: 9 (ref 5–15)
BUN: 8 mg/dL (ref 6–20)
CALCIUM: 9 mg/dL (ref 8.9–10.3)
CO2: 26 mmol/L (ref 22–32)
Chloride: 103 mmol/L (ref 101–111)
Creatinine, Ser: 0.95 mg/dL (ref 0.44–1.00)
Glucose, Bld: 348 mg/dL — ABNORMAL HIGH (ref 65–99)
Potassium: 3 mmol/L — ABNORMAL LOW (ref 3.5–5.1)
Sodium: 138 mmol/L (ref 135–145)

## 2017-08-19 LAB — BETA-HYDROXYBUTYRIC ACID: Beta-Hydroxybutyric Acid: 0.09 mmol/L (ref 0.05–0.27)

## 2017-08-19 MED ORDER — LIVING WELL WITH DIABETES BOOK
Freq: Once | Status: DC
  Filled 2017-08-19: qty 1

## 2017-08-19 MED ORDER — INSULIN ASPART 100 UNIT/ML ~~LOC~~ SOLN
4.0000 [IU] | Freq: Three times a day (TID) | SUBCUTANEOUS | Status: DC
  Administered 2017-08-19 – 2017-08-20 (×2): 4 [IU] via SUBCUTANEOUS

## 2017-08-19 MED ORDER — FAMOTIDINE 20 MG PO TABS
20.0000 mg | ORAL_TABLET | Freq: Two times a day (BID) | ORAL | Status: DC
  Administered 2017-08-19 – 2017-08-21 (×4): 20 mg via ORAL
  Filled 2017-08-19 (×4): qty 1

## 2017-08-19 MED ORDER — INSULIN GLARGINE 100 UNIT/ML ~~LOC~~ SOLN
16.0000 [IU] | Freq: Every day | SUBCUTANEOUS | Status: DC
  Administered 2017-08-19: 16 [IU] via SUBCUTANEOUS
  Filled 2017-08-19: qty 0.16

## 2017-08-19 MED ORDER — LISINOPRIL 5 MG PO TABS
5.0000 mg | ORAL_TABLET | Freq: Every day | ORAL | Status: DC
  Administered 2017-08-19 – 2017-08-21 (×3): 5 mg via ORAL
  Filled 2017-08-19 (×3): qty 1

## 2017-08-19 NOTE — Progress Notes (Signed)
Pt with orders for a walker. James with [facility] notified and will deliver to the room.  Pt without insurance listed. Per patient and her family she has Community education officer. Family to work on getting copy of card. CM following.

## 2017-08-19 NOTE — Evaluation (Signed)
Physical Therapy Evaluation Patient Details Name: Denise Mcbride MRN: [id number] DOB: Sep 26, 1960 Today's Date: 08/19/2017   History of Present Illness  Pt is a 57 y/o female admitted secondary to seizure like activity. MRI showed L frontal and insular cortex remote encephalomalacia. EEG normal. PMH includes CVA, CHF, and a fib.   Clinical Impression  Pt admitted secondary to problem above with deficits below. Upon eval, pt unsteady without use of AD and had LOB to the L requiring mod A for steadying. Pt's family adamant about pt getting up, so attempted gait a second time with RW and pt with improved steadiness requiring min to min guard A. Educated about assist required at home. Unsure if pt eligible for HHPT, as [place], however, feel pt would benefit from [facility] services. Pt and family adamant about pt going home; discussed that MD will let them know about pt d/c. Will continue to follow acutely to maximize functional mobility independence and safety.     Follow Up Recommendations Home health PT;Supervision for mobility/OOB    Equipment Recommendations  Rolling walker with 5" wheels    Recommendations for Other Services       Precautions / Restrictions Precautions Precautions: Fall;Other (comment) Precaution Comments: seizure  Restrictions Weight Bearing Restrictions: No      Mobility  Bed Mobility Overal bed mobility: Modified Independent                Transfers Overall transfer level: Needs assistance Equipment used: None;Rolling walker (2 wheeled) Transfers: Sit to/from Stand Sit to Stand: Min assist         General transfer comment: Min A for steadying assist as pt very unsteady without AD. Some dizziness reported and required seated rest.   Ambulation/Gait Ambulation/Gait assistance: Mod assist;Min guard;Min assist Ambulation Distance (Feet): 100 Feet Assistive device: Rolling walker (2 wheeled);None Gait Pattern/deviations: Step-through  pattern;Decreased stride length;Staggering left Gait velocity: Decreased  Gait velocity interpretation: <1.8 ft/sec, indicate of risk for recurrent falls General Gait Details: Slow, unsteady gait without use of AD. LOB to the L in the room without AD. Initially unsteady with use of RW as well. Pt very adamant that pt get up and move despite unsteadiness, so attempted gait a second time. Pt with improved steadiness with increased gait distance requiring min guard to min A for mobility with RW. Educated to use RW at home to increase safety.   Stairs Stairs: Yes       General stair comments: Verbally reviewed safe stair management and assist required with stairs. Will need practice during next session if pt still here.   Wheelchair Mobility    Modified Rankin (Stroke Patients Only)       Balance Overall balance assessment: Needs assistance Sitting-balance support: No upper extremity supported Sitting balance-Leahy Scale: Fair     Standing balance support: Bilateral upper extremity supported;During functional activity Standing balance-Leahy Scale: Poor Standing balance comment: Reliant on BUE support.                              Pertinent Vitals/Pain Pain Assessment: No/denies pain    Home Living Family/patient expects to be discharged to:: Private residence Living Arrangements: Children Available Help at Discharge: Family;Available PRN/intermittently Type of Home: House Home Access: Stairs to enter Entrance Stairs-Rails: None Entrance Stairs-Number of Steps: 3-4 Home Layout: Two level Home Equipment: None Additional Comments: Family assisted with providing home information.     Prior Function Level  of Independence: Independent               Hand Dominance        Extremity/Trunk Assessment   Upper Extremity Assessment Upper Extremity Assessment: Overall WFL for tasks assessed    Lower Extremity Assessment Lower Extremity Assessment: Overall  WFL for tasks assessed    Cervical / Trunk Assessment Cervical / Trunk Assessment: Normal  Communication   Communication: No difficulties  Cognition Arousal/Alertness: Suspect due to medications Behavior During Therapy: Flat affect Overall Cognitive Status: Impaired/Different from baseline Area of Impairment: Problem solving                             Problem Solving: Slow processing General Comments: Noted slow processing and slow response when asked questions.       General Comments General comments (skin integrity, edema, etc.): Multiple family members present during session.     Exercises     Assessment/Plan    PT Assessment Patient needs continued PT services  PT Problem List Decreased balance;Decreased mobility;Decreased cognition;Decreased knowledge of use of DME;Decreased knowledge of precautions       PT Treatment Interventions DME instruction;Gait training;Stair training;Therapeutic exercise;Therapeutic activities;Functional mobility training;Balance training;Patient/family education    PT Goals (Current goals can be found in the Care Plan section)  Acute Rehab PT Goals Patient Stated Goal: to go home per pt and family  PT Goal Formulation: With patient/family Time For Goal Achievement: 09/02/17 Potential to Achieve Goals: Good    Frequency Min 3X/week   Barriers to discharge        Co-evaluation               AM-PAC PT "6 Clicks" Daily Activity  Outcome Measure Difficulty turning over in bed (including adjusting bedclothes, sheets and blankets)?: A Little Difficulty moving from lying on back to sitting on the side of the bed? : A Little Difficulty sitting down on and standing up from a chair with arms (e.g., wheelchair, bedside commode, etc,.)?: Unable Help needed moving to and from a bed to chair (including a wheelchair)?: A Little Help needed walking in hospital room?: A Little Help needed climbing 3-5 steps with a railing? : A  Lot 6 Click Score: 15    End of Session Equipment Utilized During Treatment: Gait belt Activity Tolerance: Patient tolerated treatment well Patient left: in bed;with call bell/phone within reach;with bed alarm set;with family/visitor present(pt sitting at EOB ) Nurse Communication: Mobility status PT Visit Diagnosis: Unsteadiness on feet (R26.81);Difficulty in walking, not elsewhere classified (R26.2)    Time: 1510-1530 PT Time Calculation (min) (ACUTE ONLY): 20 min   Charges:   PT Evaluation $PT Eval Low Complexity: 1 Low     PT G Codes:        Gladys Damme, PT, DPT  Acute Rehabilitation Services  Pager: [phone]   Lehman Prom 08/19/2017, 3:47 PM

## 2017-08-19 NOTE — Progress Notes (Signed)
PROGRESS NOTE    Denise Mcbride  ZOX:[id number] DOB: 06/17/1960 DOA: 08/17/2017 PCP: Patient, No Pcp Per  Brief Narrative 57 y.o. female with medical history significant for history of stroke, chronic CHF, and anticoagulation with Eliquis, now presenting to the emergency department for evaluation of seizures.  Patient is unable to contribute to the history due to her clinical condition.  [place], here for a family reunion, seem to be doing well, but was then noted to have seizure-like activity and EMS was called.  She had a second seizure in the presence of EMS that was self-limited.  Family members do not believe that she has any history of seizures.  ED Course: Upon arrival to the ED, patient is found to be afebrile, saturating well on room air, tachypneic, mildly tachycardic, and with stable blood pressure.  EKG features atrial fibrillation with LVH and repolarization abnormality.  Noncontrast head CT is negative for acute intracranial abnormality.  Chemistry panel is notable for serum glucose of 533, sodium 131, bicarbonate 18, anion gap 18, and slight elevations in alkaline phosphatase, AST, and total bilirubin.  CBC is unremarkable, troponin is normal, UDS negative, and urinalysis notable for glucosuria, but not indicative of infection.  Patient had another generalized seizure in the emergency department that lasted roughly 30 seconds.  She was loaded with 1 g of IV Keppra, started on insulin infusion, treated with 20 mEq of IV potassium, and neurology was consulted by the ED physician.  Patient remains hemodynamically stable, but is obtunded and will be admitted for ongoing evaluation and management of new seizures with mild/early diabetic ketoacidosis.    Assessment & Plan:   Principal Problem:   Seizures (HCC) Active Problems:   History of completed stroke   Chronic CHF (congestive heart failure) (HCC)   DKA (diabetic ketoacidoses) (HCC)   Newly diagnosed diabetes  (HCC)   Unspecified atrial fibrillation (HCC)  1. Seizures   - Presents after a seizure, had another en route, and a 3rd in ED that was witnessed and generalized with gaze fixed to right and followed by somnolence  - Head CT is negative for acute findings  - Loaded with 1 g IV Keppra in ED  - Neurology is consulting and much appreciated  - MRI brain --Focal area of remote encephalomalacia involving the left frontal operculum and insular cortex measuring 2.3 x 1.8 x 3.4 cm could certainly serve as a seizure focus. 2. No acute intracranial abnormality. 2. DKA  - Presents with new seizures and found to have serum glucose 533 with mild acidosis,RESOLVED  - No known hx of DM  - Started LANTUS AND NOVOLG AND SSI.HBA1C VERY HIGH.OVER 13.CONSULTED DM COUNSELING. 3. Atrial fibrillation  - Presents in a fib with rates low 100's  - Suspect this is not new as she is on Eliquis  - CHADS-VASc at least 4 (gender, CHF, CVA x2)  - Continue Eliquis and beta-blocker    4. Chronic CHF ?RESTART LASIX TOMORROW?STABLE. -POSITIVE BY 3.8 LIT.  5. Hx of CVA - Presents with seizures - Head CT negative for acute findings  -KEPPRA 500 BID - Continue Lipitor and Eliquis       DVT prophylaxis:ELIQUIS Code Status:FULL Family Communication:DW PATIENT ATTEMPTED TO CALL THE RELATIVE WITH NO ANSWER. Disposition Plan: TBD  Consultants: NEURO  Procedures:NONE Antimicrobials NONE Subjective: AWAKE RESTING Objective: Vitals:   08/18/17 2339 08/19/17 0400 08/19/17 0730 08/19/17 1202  BP: (!) 131/96 115/86 140/88 (!) 129/98  Pulse: 62 63 (!)  58 86  Resp: Temp: 98.2 F (36.8 C) (!) 97.5 F (36.4 C) 98.1 F (36.7 C)   TempSrc: Oral Oral    SpO2: 100% 100% 99% 100%  Weight:  80.8 kg (178 lb 2.1 oz)    Height:        Intake/Output Summary (Last 24 hours) at 08/19/2017 1309 Last data filed at 08/18/2017 2252 Gross per 24 hour  Intake 220 ml  Output -  Net 220 ml   Filed Weights    08/18/17 1517 08/19/17 0400  Weight: 79.7 kg (175 lb 11.3 oz) 80.8 kg (178 lb 2.1 oz)    Examination:  General exam: Appears calm and comfortable  Respiratory system: Clear to auscultation. Respiratory effort normal. Cardiovascular system: S1 & S2 heard, RRR. No JVD, murmurs, rubs, gallops or clicks. No pedal edema. Gastrointestinal system: Abdomen is nondistended, soft and nontender. No organomegaly or masses felt. Normal bowel sounds heard. Central nervous system: Alert and oriented. No focal neurological deficits. Extremities: Symmetric 5 x 5 power. Skin: No rashes, lesions or ulcers Psychiatry: Judgement and insight appear normal. Mood & affect appropriate.     Data Reviewed: I have personally reviewed following labs and imaging studies  CBC: Recent Labs  Lab 08/17/17 2351  WBC 6.0  NEUTROABS 3.1  HGB 15.9*  HCT 45.8  MCV 82.7  PLT 182   Basic Metabolic Panel: Recent Labs  Lab 08/17/17 2351 08/18/17 0404 08/18/17 1340 08/18/17 1620  NA 131* 136 138 139  K 3.6 3.2* 3.0* 3.7  CL 95* 104 104 107  CO2 18* 24 23 20*  GLUCOSE 533* 245* 213* 294*  BUN 9 9 5* 6  CREATININE 0.88 0.59 0.58 0.59  CALCIUM 9.8 9.2 9.2 9.0  MG  --  1.8  --   --    GFR: Estimated Creatinine Clearance: 84.9 mL/min (by C-G formula based on SCr of 0.59 mg/dL). Liver Function Tests: Recent Labs  Lab 08/17/17 2351  AST 51*  ALT 25  ALKPHOS 151*  BILITOT 1.6*  PROT 8.0  ALBUMIN 4.5   No results for input(s): LIPASE, AMYLASE in the last 168 hours. No results for input(s): AMMONIA in the last 168 hours. Coagulation Profile: Recent Labs  Lab 08/17/17 2351  INR 1.25   Cardiac Enzymes: No results for input(s): CKTOTAL, CKMB, CKMBINDEX, TROPONINI in the last 168 hours. BNP (last 3 results) No results for input(s): PROBNP in the last 8760 hours. HbA1C: Recent Labs    08/18/17 0409  HGBA1C 13.9*   CBG: Recent Labs  Lab 08/18/17 1200 08/18/17 1334 08/18/17 1638  08/18/17 2113 08/19/17 0625  GLUCAP 230* 205* 266* 115* 160*   Lipid Profile: No results for input(s): CHOL, HDL, LDLCALC, TRIG, CHOLHDL, LDLDIRECT in the last 72 hours. Thyroid Function Tests: No results for input(s): TSH, T4TOTAL, FREET4, T3FREE, THYROIDAB in the last 72 hours. Anemia Panel: No results for input(s): VITAMINB12, FOLATE, FERRITIN, TIBC, IRON, RETICCTPCT in the last 72 hours. Sepsis Labs: No results for input(s): PROCALCITON, LATICACIDVEN in the last 168 hours.  No results found for this or any previous visit (from the past 240 hour(s)).       Radiology Studies: Ct Head Wo Contrast  Result Date: 08/18/2017 CLINICAL DATA:  Seizure EXAM: CT HEAD WITHOUT CONTRAST TECHNIQUE: Contiguous axial images were obtained from the base of the skull through the vertex without intravenous contrast. COMPARISON:  None. FINDINGS: Brain: No acute territorial infarction, hemorrhage or intracranial mass. The ventricles are nonenlarged.  Left frontal lobe encephalomalacia. Encephalomalacia at the insula Vascular: No hyperdense vessels. Scattered calcification at the carotid siphon Skull: Normal. Negative for fracture or focal lesion. Sinuses/Orbits: No acute finding. Other: None IMPRESSION: 1. No CT evidence for acute intracranial abnormality. 2. Left frontal lobe and insular encephalomalacia. Electronically Signed   By: Jasmine Pang M.D.   On: 08/18/2017 00:41   Mr Brain Wo Contrast  Result Date: 08/18/2017 CLINICAL DATA:  Seizure, new, abnormal neuro exam, nontraumatic. No known history of seizures. Patient is unable to contribute to history given recurrent state. EXAM: MRI HEAD WITHOUT CONTRAST TECHNIQUE: Multiplanar, multiecho pulse sequences of the brain and surrounding structures were obtained without intravenous contrast. COMPARISON:  CT head without contrast from the same day. FINDINGS: Brain: A focal area of remote hemorrhagic encephalomalacia is present in the left frontal operculum. 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 There is volume loss. No mass effect is present. This involves the insular cortex without involvement of temporal lobe. Dedicated imaging of the temporal lobes demonstrate symmetric size and signal of the hippocampal structures. No acute infarct or hemorrhage is present. There is slight ex vacuo dilation of the left lateral ventricle related to the area of encephalomalacia. The ventricles are otherwise of normal size. No significant extra-axial fluid collection is present. The internal auditory canals are within normal limits bilaterally. The brainstem and cerebellum are normal. Vascular: Flow is present in the major intracranial arteries. Skull and upper cervical spine: The skull base is within normal limits. The craniocervical junction is normal. Upper cervical spine is within normal limits. Sinuses/Orbits: The paranasal sinuses and mastoid air cells are clear. The globes and orbits are within normal limits. IMPRESSION: 1. Focal area of remote encephalomalacia involving the left frontal operculum and insular cortex measuring 2.3 x 1.8 x 3.4 cm could certainly serve as a seizure focus. 2. No acute intracranial abnormality. Electronically Signed   By: Marin Roberts M.D.   On: 08/18/2017 08:36        Scheduled Meds: . apixaban  5 mg Oral BID  . atorvastatin  80 mg Oral q1800  . carvedilol  6.25 mg Oral BID WC  . famotidine  20 mg Oral BID  . insulin aspart  0-15 Units Subcutaneous TID WC  . levETIRAcetam  500 mg Oral BID  . lisinopril  5 mg Oral Daily   Continuous Infusions:   LOS: 1 day   E MATHEWS  If 7PM-7AM, please contact night-coverage [email or web address] Password [email or web address] 08/19/2017, 1:09 PM

## 2017-08-20 ENCOUNTER — Encounter (HOSPITAL_COMMUNITY): Payer: Self-pay | Admitting: General Practice

## 2017-08-20 ENCOUNTER — Other Ambulatory Visit: Payer: Self-pay

## 2017-08-20 LAB — BASIC METABOLIC PANEL
ANION GAP: 12 (ref 5–15)
BUN: 12 mg/dL (ref 6–20)
CHLORIDE: 104 mmol/L (ref 101–111)
CO2: 24 mmol/L (ref 22–32)
Calcium: 9.4 mg/dL (ref 8.9–10.3)
Creatinine, Ser: 0.72 mg/dL (ref 0.44–1.00)
Glucose, Bld: 228 mg/dL — ABNORMAL HIGH (ref 65–99)
POTASSIUM: 3 mmol/L — AB (ref 3.5–5.1)
SODIUM: 140 mmol/L (ref 135–145)

## 2017-08-20 LAB — CBC WITH DIFFERENTIAL/PLATELET
Abs Immature Granulocytes: 0 10*3/uL (ref 0.0–0.1)
BASOS ABS: 0 10*3/uL (ref 0.0–0.1)
Basophils Relative: 1 %
EOS ABS: 0.1 10*3/uL (ref 0.0–0.7)
EOS PCT: 2 %
HCT: 39.9 % (ref 36.0–46.0)
HEMOGLOBIN: 14 g/dL (ref 12.0–15.0)
IMMATURE GRANULOCYTES: 0 %
LYMPHS PCT: 34 %
Lymphs Abs: 1.4 10*3/uL (ref 0.7–4.0)
MCH: 28.6 pg (ref 26.0–34.0)
MCHC: 35.1 g/dL (ref 30.0–36.0)
MCV: 81.6 fL (ref 78.0–100.0)
Monocytes Absolute: 0.5 10*3/uL (ref 0.1–1.0)
Monocytes Relative: 13 %
NEUTROS PCT: 50 %
Neutro Abs: 2.1 10*3/uL (ref 1.7–7.7)
Platelets: 146 10*3/uL — ABNORMAL LOW (ref 150–400)
RBC: 4.89 MIL/uL (ref 3.87–5.11)
RDW: 12.6 % (ref 11.5–15.5)
WBC: 4.2 10*3/uL (ref 4.0–10.5)

## 2017-08-20 LAB — GLUCOSE, CAPILLARY
GLUCOSE-CAPILLARY: 192 mg/dL — AB (ref 65–99)
GLUCOSE-CAPILLARY: 134 mg/dL — AB (ref 65–99)
GLUCOSE-CAPILLARY: 217 mg/dL — AB (ref 65–99)
Glucose-Capillary: 286 mg/dL — ABNORMAL HIGH (ref 65–99)
Glucose-Capillary: 188 mg/dL — ABNORMAL HIGH (ref 65–99)
Glucose-Capillary: 136 mg/dL — ABNORMAL HIGH (ref 65–99)
Glucose-Capillary: 126 mg/dL — ABNORMAL HIGH (ref 65–99)

## 2017-08-20 MED ORDER — GLIMEPIRIDE 4 MG PO TABS
2.0000 mg | ORAL_TABLET | Freq: Every day | ORAL | Status: DC
  Administered 2017-08-20 – 2017-08-21 (×2): 2 mg via ORAL
  Filled 2017-08-20 (×2): qty 1

## 2017-08-20 MED ORDER — POTASSIUM CHLORIDE CRYS ER 20 MEQ PO TBCR
40.0000 meq | EXTENDED_RELEASE_TABLET | ORAL | Status: AC
  Administered 2017-08-20 (×2): 40 meq via ORAL
  Filled 2017-08-20 (×2): qty 2

## 2017-08-20 MED ORDER — ONDANSETRON HCL 4 MG/2ML IJ SOLN: 4.0000 mg | Freq: Four times a day (QID) | INTRAMUSCULAR | Status: DC | PRN

## 2017-08-20 NOTE — Progress Notes (Signed)
Spoke with patient about her new diabetes diagnosis. Patient responded very slowly to my questions. Difficult to understand completely what she was saying. States that she lives with her son in [place].  Son is not here in [place]. Spoke with her sister and brother in law in the room about her situation at home. Family concerned about getting her back home.   Recommend that patient be discharged on oral medication and follow up as quickly as possible with her PCP when back home. Patient does not seem to reliable in following through with being able to take insulin. Patient also states that she does not want to take insulin injections.   Dr. Jerolyn Center recommends starting patient on Amaryl once per day. She may be discharged on Metformin, as well. Recommended to family to call her PCP and get an appointment for when she gets home. Will continue Novolog MODERATE correction scale TID while in the hospital.   Smith Mince RN BSN CDE Diabetes Coordinator Pager: [phone]  8am-5pm

## 2017-08-20 NOTE — Progress Notes (Signed)
PROGRESS NOTE    Denise Mcbride  VWU:[id number] DOB: 26-May-1960 DOA: 08/17/2017 PCP: Patient, No Pcp Per  Brief Narrative:57 y.o.femalewith medical history significant forhistory of stroke, chronic CHF, and anticoagulation with Eliquis, now presenting to the emergency department for evaluation of seizures. Patient is unable to contribute to the history due to her clinical condition. She is from out of state, here for a family reunion, seem to be doing well, but was then noted to have seizure-like activity and EMS was called. She had a second seizure in the presence of EMS that was self-limited. Family members do not believe that she has any history of seizures.  ED Course:Upon arrival to the ED, patient is found to be afebrile, saturating well on room air, tachypneic, mildly tachycardic, and with stable blood pressure. EKG features atrial fibrillation with LVH and repolarization abnormality. Noncontrast head CT is negative for acute intracranial abnormality. Chemistry panel is notable for serum glucose of 533, sodium 131, bicarbonate 18, anion gap 18, and slight elevations in alkaline phosphatase, AST, and total bilirubin. CBC is unremarkable, troponin is normal, UDS negative, and urinalysis notable for glucosuria, but not indicative of infection. Patient had another generalized seizure in the emergency department that lasted roughly 30 seconds. She was loaded with 1 g of IV Keppra, started on insulin infusion, treated with 20 mEq of IV potassium, and neurology was consulted by the ED physician. Patient remains hemodynamically stable, but is obtunded and will be admitted for ongoing evaluation and management of new seizures with mild/earlydiabetic ketoacidosis.    Assessment & Plan:   Principal Problem:   Seizures (HCC) Active Problems:   History of completed stroke   Chronic CHF (congestive heart failure) (HCC)   DKA (diabetic ketoacidoses) (HCC)   Newly diagnosed diabetes  (HCC)   Unspecified atrial fibrillation (HCC)  1.Seizures -Presents after a seizure, had another en route, and a 3rd in ED that was witnessed and generalized with gaze fixed to right and followed by somnolence -Head CT is negative for acute findings -Loaded with 1 g IV Keppra in ED -Neurology is consulting and much appreciated -MRI brain --Focal area of remote encephalomalacia involving the left frontal operculum and insular cortex measuring 2.3 x 1.8 x 3.4 cm could certainly serve as a seizure focus. 2. No acute intracranial abnormality. 2.DKA -Presents with new seizures and found to have serum glucose 533 with mild acidosis,RESOLVED.will start oral agents because of the travel and patient unable to process insulin and recommendations. -3.Atrial fibrillation -Presents in a fib with rates low 100's -Suspect this is not new as she is on Eliquis -CHADS-VASc at least 4 (gender, CHF, CVA x2) -Continue Eliquis and beta-blocker  4.Chronic CHF?RESTART LASIX TOMORROW?STABLE. -POSITIVE BY 3.8 LIT.  5.Hx of CVA - Presents with seizures -Head CT negative for acute findings  -KEPPRA 500 BID -Continue Lipitor and Eliquis      DVT prophylaxis:lovenox Code Status: full Family Communication:dw family Disposition Plan:dc in am Consultants: neuro   Procedures:none Antimicrobials:none Subjective:no complaints   Objective: Vitals:   08/19/17 1953 08/19/17 2351 08/20/17 0322 08/20/17 0845  BP: 122/73 128/71 125/66 125/80  Pulse: 63 65 69 (!) 58  Resp: Temp: 98.1 F (36.7 C) 98 F (36.7 C) 98.3 F (36.8 C) 98.4 F (36.9 C)  TempSrc: Oral Oral Oral Oral  SpO2: 99% 99% 99% 98%  Weight:   80.1 kg (176 lb 9.4 oz)   Height:       No intake or output data  in the 24 hours ending 08/20/17 1306 Filed Weights   08/18/17 1517 08/19/17 0400 08/20/17 0322  Weight: 79.7 kg (175 lb 11.3 oz) 80.8 kg (178 lb 2.1 oz) 80.1 kg (176 lb 9.4 oz)     Examination:  General exam: Appears calm and comfortable  Respiratory system: Clear to auscultation. Respiratory effort normal. Cardiovascular system: S1 & S2 heard, RRR. No JVD, murmurs, rubs, gallops or clicks. No pedal edema. Gastrointestinal system: Abdomen is nondistended, soft and nontender. No organomegaly or masses felt. Normal bowel sounds heard. Central nervous system: Alert and oriented. No focal neurological deficits. Extremities: Symmetric 5 x 5 power. Skin: No rashes, lesions or ulcers Psychiatry: Judgement and insight appear normal. Mood & affect appropriate.     Data Reviewed: I have personally reviewed following labs and imaging studies  CBC: Recent Labs  Lab 08/17/17 2351 08/19/17 1338 08/20/17 0517  WBC 6.0 5.0 4.2  NEUTROABS 3.1  --  2.1  HGB 15.9* 14.4 14.0  HCT 45.8 40.6 39.9  MCV 82.7 80.6 81.6  PLT 182 162 146*   Basic Metabolic Panel: Recent Labs  Lab 08/18/17 0404 08/18/17 1340 08/18/17 1620 08/19/17 1338 08/20/17 0517  NA 136 138 139 138 140  K 3.2* 3.0* 3.7 3.0* 3.0*  CL 104 104 107 103 104  CO2 24 23 20* 26 24  GLUCOSE 245* 213* 294* 348* 228*  BUN 9 5* CREATININE 0.59 0.58 0.59 0.95 0.72  CALCIUM 9.2 9.2 9.0 9.0 9.4  MG 1.8  --   --   --   --    GFR: Estimated Creatinine Clearance: 84.5 mL/min (by C-G formula based on SCr of 0.72 mg/dL). Liver Function Tests: Recent Labs  Lab 08/17/17 2351  AST 51*  ALT 25  ALKPHOS 151*  BILITOT 1.6*  PROT 8.0  ALBUMIN 4.5   No results for input(s): LIPASE, AMYLASE in the last 168 hours. No results for input(s): AMMONIA in the last 168 hours. Coagulation Profile: Recent Labs  Lab 08/17/17 2351  INR 1.25   Cardiac Enzymes: No results for input(s): CKTOTAL, CKMB, CKMBINDEX, TROPONINI in the last 168 hours. BNP (last 3 results) No results for input(s): PROBNP in the last 8760 hours. HbA1C: Recent Labs    08/18/17 0409  HGBA1C 13.9*   CBG: Recent Labs  Lab  08/19/17 1727 08/19/17 2113 08/20/17 0604 08/20/17 0745 08/20/17 1138  GLUCAP 188* 281* 192* 188* 136*   Lipid Profile: No results for input(s): CHOL, HDL, LDLCALC, TRIG, CHOLHDL, LDLDIRECT in the last 72 hours. Thyroid Function Tests: No results for input(s): TSH, T4TOTAL, FREET4, T3FREE, THYROIDAB in the last 72 hours. Anemia Panel: No results for input(s): VITAMINB12, FOLATE, FERRITIN, TIBC, IRON, RETICCTPCT in the last 72 hours. Sepsis Labs: No results for input(s): PROCALCITON, LATICACIDVEN in the last 168 hours.  No results found for this or any previous visit (from the past 240 hour(s)).       Radiology Studies: No results found.      Scheduled Meds: . apixaban  5 mg Oral BID  . atorvastatin  80 mg Oral q1800  . carvedilol  6.25 mg Oral BID WC  . famotidine  20 mg Oral BID  . glimepiride  2 mg Oral Q breakfast  . insulin aspart  0-15 Units Subcutaneous TID WC  . levETIRAcetam  500 mg Oral BID  . lisinopril  5 mg Oral Daily  . living well with diabetes book   Does not apply Once   Continuous  Infusions:   LOS: 2 days      Alwyn Ren, MD [facility] If 7PM-7AM, please contact night-coverage [email or web address] Password [email or web address] 08/20/2017, 1:06 PM

## 2017-08-20 NOTE — Discharge Instructions (Signed)

## 2017-08-20 NOTE — Progress Notes (Signed)
Physical Therapy Treatment Patient Details Name: Denise Mcbride MRN: [id number] DOB: [birth year] Today's Date: 08/20/2017    History of Present Illness Pt is a 57 y/o female admitted secondary to seizure like activity. MRI showed L frontal and insular cortex remote encephalomalacia. EEG normal. PMH includes CVA, CHF, and a fib.     PT Comments    Patient tolerated session well. Pt does demonstrate unsteady gait and with lateral LOB X2 requiring RW and assistance for safety. Continue to progress as tolerated.    Follow Up Recommendations  Home health PT;Supervision for mobility/OOB     Equipment Recommendations  Rolling walker with 5" wheels    Recommendations for Other Services       Precautions / Restrictions Precautions Precautions: Fall;Other (comment) Precaution Comments: seizure  Restrictions Weight Bearing Restrictions: No    Mobility  Bed Mobility Overal bed mobility: Modified Independent                Transfers Overall transfer level: Needs assistance Equipment used: Rolling walker (2 wheeled) Transfers: Sit to/from Stand Sit to Stand: Min guard         General transfer comment: min guard for safety  Ambulation/Gait Ambulation/Gait assistance: Mod assist;Min assist Ambulation Distance (Feet): 200 Feet Assistive device: Rolling walker (2 wheeled);None Gait Pattern/deviations: Step-through pattern;Decreased stride length;Staggering left Gait velocity: Decreased    General Gait Details: pt with lateral LOB with horizontal head turns and near scissoring at times   Stairs Stairs: Yes Stairs assistance: Min guard Stair Management: One rail Right Number of Stairs: 10 General stair comments: cues for safety   Wheelchair Mobility    Modified Rankin (Stroke Patients Only)       Balance Overall balance assessment: Needs assistance Sitting-balance support: No upper extremity supported Sitting balance-Leahy Scale: Fair     Standing balance  support: Bilateral upper extremity supported;During functional activity Standing balance-Leahy Scale: Poor                              Cognition Arousal/Alertness: Awake/alert Behavior During Therapy: Flat affect Overall Cognitive Status: Impaired/Different from baseline Area of Impairment: Problem solving;Attention;Safety/judgement                   Current Attention Level: Sustained     Safety/Judgement: Decreased awareness of safety   Problem Solving: Slow processing General Comments: pt not very communicative during session; Noted slow processing and slow response when asked questions.       Exercises      General Comments        Pertinent Vitals/Pain      Home Living Family/patient expects to be discharged to:: Unsure Living Arrangements: Other relatives                  Prior Function            PT Goals (current goals can now be found in the care plan section) Acute Rehab PT Goals Patient Stated Goal: to go home per pt and family  PT Goal Formulation: With patient/family Time For Goal Achievement: 09/02/17 Potential to Achieve Goals: Good Progress towards PT goals: Progressing toward goals    Frequency    Min 3X/week      PT Plan Current plan remains appropriate    Co-evaluation              AM-PAC PT "6 Clicks" Daily Activity  Outcome Measure  Difficulty turning over in bed (  including adjusting bedclothes, sheets and blankets)?: A Little Difficulty moving from lying on back to sitting on the side of the bed? : A Little Difficulty sitting down on and standing up from a chair with arms (e.g., wheelchair, bedside commode, etc,.)?: Unable Help needed moving to and from a bed to chair (including a wheelchair)?: A Little Help needed walking in hospital room?: A Little Help needed climbing 3-5 steps with a railing? : A Lot 6 Click Score: 15    End of Session Equipment Utilized During Treatment: Gait belt Activity  Tolerance: Patient tolerated treatment well Patient left: with call bell/phone within reach;in chair;with chair alarm set Nurse Communication: Mobility status PT Visit Diagnosis: Unsteadiness on feet (R26.81);Difficulty in walking, not elsewhere classified (R26.2)     Time: 4098-1191 PT Time Calculation (min) (ACUTE ONLY): 26 min  Charges:  $Gait Training: 23-37 mins                    G Codes:       Erline Levine, PTA Pager: [phone]     Carolynne Edouard 08/20/2017, 9:30 AM

## 2017-08-21 LAB — BASIC METABOLIC PANEL
Anion gap: 11 (ref 5–15)
BUN: 14 mg/dL (ref 6–20)
CALCIUM: 9.5 mg/dL (ref 8.9–10.3)
CO2: 26 mmol/L (ref 22–32)
Chloride: 106 mmol/L (ref 101–111)
Creatinine, Ser: 0.72 mg/dL (ref 0.44–1.00)
GFR calc Af Amer: >60 mL/min (ref >60)
GFR calc non Af Amer: >60 mL/min (ref >60)
GLUCOSE: 169 mg/dL — AB (ref 65–99)
Potassium: 3.2 mmol/L — ABNORMAL LOW (ref 3.5–5.1)
Sodium: 143 mmol/L (ref 135–145)

## 2017-08-21 LAB — MAGNESIUM: Magnesium: 1.7 mg/dL (ref 1.7–2.4)

## 2017-08-21 LAB — GLUCOSE, CAPILLARY
Glucose-Capillary: 174 mg/dL — ABNORMAL HIGH (ref 65–99)
Glucose-Capillary: 152 mg/dL — ABNORMAL HIGH (ref 65–99)

## 2017-08-21 MED ORDER — GLIMEPIRIDE 2 MG PO TABS: 2.0000 mg | ORAL_TABLET | Freq: Every day | ORAL | 0 refills | Status: AC

## 2017-08-21 MED ORDER — BLOOD GLUCOSE METER KIT: PACK | 0 refills | Status: AC

## 2017-08-21 MED ORDER — LEVETIRACETAM 500 MG PO TABS: 500.0000 mg | ORAL_TABLET | Freq: Two times a day (BID) | ORAL | 0 refills | Status: DC

## 2017-08-21 MED ORDER — LISINOPRIL 5 MG PO TABS: 5.0000 mg | ORAL_TABLET | Freq: Every day | ORAL | 0 refills | Status: DC

## 2017-08-21 MED ORDER — BLOOD GLUCOSE METER KIT: PACK | 0 refills | Status: DC

## 2017-08-21 MED ORDER — LISINOPRIL 5 MG PO TABS: 5.0000 mg | ORAL_TABLET | Freq: Every day | ORAL | 0 refills | Status: AC

## 2017-08-21 MED ORDER — LEVETIRACETAM 500 MG PO TABS: 500.0000 mg | ORAL_TABLET | Freq: Two times a day (BID) | ORAL | 0 refills | Status: AC

## 2017-08-21 MED ORDER — GLIMEPIRIDE 2 MG PO TABS: 2.0000 mg | ORAL_TABLET | Freq: Every day | ORAL | 0 refills | Status: DC

## 2017-08-21 NOTE — Discharge Summary (Signed)
Physician Discharge Summary  Devanshi Califf VWP:[id number] DOB: May 06, 1960 DOA: 08/17/2017  PCP: Denise Mcbride, No Pcp Per  Admit date: 08/17/2017 Discharge date: 08/21/2017  Admitted From: Home Disposition: Home Recommendations for Outpatient Follow-up:  1. Follow up with PCP in 1-2 weeks 2. Please obtain BMP/CBC in one week  Home Health none Equipment/Devices none Discharge Condition stable CODE STATUS: Full code Diet recommendation:  Carb modified Brief/Interim Summary:57 y.o.femalewith medical history significant forhistory of stroke, chronic CHF, and anticoagulation with Eliquis, now presenting to the emergency department for evaluation of seizures. Denise Mcbride is unable to contribute to the history due to her clinical condition. [place], here for a family reunion, seem to be doing well, but was then noted to have seizure-like activity and EMS was called. She had a second seizure in the presence of EMS that was self-limited. Family members do not believe that she has any history of seizures.  ED Course:Upon arrival to the ED, Denise Mcbride is found to be afebrile, saturating well on room air, tachypneic, mildly tachycardic, and with stable blood pressure. EKG features atrial fibrillation with LVH and repolarization abnormality. Noncontrast head CT is negative for acute intracranial abnormality. Chemistry panel is notable for serum glucose of 533, sodium 131, bicarbonate 18, anion gap 18, and slight elevations in alkaline phosphatase, AST, and total bilirubin. CBC is unremarkable, troponin is normal, UDS negative, and urinalysis notable for glucosuria, but not indicative of infection. Denise Mcbride had another generalized seizure in the emergency department that lasted roughly 30 seconds. She was loaded with 1 g of IV Keppra, started on insulin infusion, treated with 20 mEq of IV potassium, and neurology was consulted by the ED physician. Denise Mcbride remains hemodynamically stable, but  is obtunded and will be admitted for ongoing evaluation and management of new seizures with mild diabetic ketoacidosis.   Discharge Diagnoses:  Principal Problem:   Seizures ([place]) Active Problems:   History of completed stroke   Chronic CHF (congestive heart failure) (HCC)   DKA (diabetic ketoacidoses) ([place])   Newly diagnosed diabetes ([place])   Unspecified atrial fibrillation ([place]) 1] new onset seizures witnessed by ED staff with prolonged postictal.  Head CT negative for acute findings MRI showed focal area of remote encephalomalacia involving the left frontal operculum and insular cortex measuring 2.3 x 1.8 x 3.4 cm could certainly serve as if seizure focus.  She was treated with a loading dose of IV Keppra and changed to Keppra 500 mg twice a day.  She is asked to not to drive for the next 6 months and to follow-up with her PCP at [facility] and to get a referral to see a neurologist.  2] mild DKA presented with a serum glucose of 533 which was treated with IV insulin upon admission.  And then changed to Lantus and NovoLog.  But because of the Denise Mcbride's difficulty in understanding due to history of stroke some apraxia and not being able to give insulin by herself and with the travel that is involved from going from [place] to [place] it was changed to Amaryl 2 mg daily.  At that point Lantus was stopped.  However she her hemoglobin A1c was very high upon admission at 13.  Her blood sugar and hemoglobin A1c should be closely monitored and her diabetic regimen should be adjusted by PCP.  3] history of atrial fibrillation on Eliquis and Coreg continue.  4] history of chronic CHF which was stable during his hospital admission.  Restart Lasix and potassium  upon discharge.  5] history of stroke presented with seizures continue Keppra Lipitor and Eliquis.   Discharge Instructions  Discharge Instructions    Ambulatory referral to Nutrition and Diabetic Education   Complete by:  As  directed    Call MD for:  difficulty breathing, headache or visual disturbances   Complete by:  As directed    Call MD for:  persistant dizziness or light-headedness   Complete by:  As directed    Call MD for:  persistant nausea and vomiting   Complete by:  As directed    Call MD for:  severe uncontrolled pain   Complete by:  As directed    Diet - low sodium heart healthy   Complete by:  As directed    Increase activity slowly   Complete by:  As directed      Allergies as of 08/21/2017   No Known Allergies     Medication List    STOP taking these medications   amLODipine 10 MG tablet Commonly known as:  NORVASC   famotidine 20 MG tablet Commonly known as:  PEPCID     TAKE these medications   atorvastatin 80 MG tablet Commonly known as:  LIPITOR Take 80 mg by mouth every evening.   blood glucose meter kit and supplies Dispense based on Denise Mcbride and insurance preference. Use up to four times daily as directed. (FOR ICD-10 E10.9, E11.9).   carvedilol 6.25 MG tablet Commonly known as:  COREG Take 6.25 mg by mouth 2 (two) times daily.   ELIQUIS 5 MG Tabs tablet Generic drug:  apixaban Take 5 mg by mouth 2 (two) times daily.   furosemide 20 MG tablet Commonly known as:  LASIX Take 20 mg by mouth every morning.   glimepiride 2 MG tablet Commonly known as:  AMARYL Take 1 tablet (2 mg total) by mouth daily with breakfast. Start taking on:  08/22/2017   KLOR-CON M10 10 MEQ tablet Generic drug:  potassium chloride Take 10 mEq by mouth every morning.   levETIRAcetam 500 MG tablet Commonly known as:  KEPPRA Take 1 tablet (500 mg total) by mouth 2 (two) times daily.   lisinopril 5 MG tablet Commonly known as:  PRINIVIL,ZESTRIL Take 1 tablet (5 mg total) by mouth daily.            Durable Medical Equipment  (From admission, onward)        Start     Ordered   08/19/17 1602  For home use only DME Walker rolling  Once    Question:  Denise Mcbride needs a walker to  treat with the following condition  Answer:  Seizures ([place])   08/19/17 1602      No Known Allergies  Consultations: NEURO  Procedures/Studies: Ct Head Wo Contrast  Result Date: 08/18/2017 CLINICAL DATA:  Seizure EXAM: CT HEAD WITHOUT CONTRAST TECHNIQUE: Contiguous axial images were obtained from the base of the skull through the vertex without intravenous contrast. COMPARISON:  None. FINDINGS: Brain: No acute territorial infarction, hemorrhage or intracranial mass. The ventricles are nonenlarged. Left frontal lobe encephalomalacia. Encephalomalacia at the insula Vascular: No hyperdense vessels. Scattered calcification at the carotid siphon Skull: Normal. Negative for fracture or focal lesion. Sinuses/Orbits: No acute finding. Other: None IMPRESSION: 1. No CT evidence for acute intracranial abnormality. 2. Left frontal lobe and insular encephalomalacia. Electronically Signed   By: Donavan Foil M.D.   On: 08/18/2017 00:41   Mr Brain Wo Contrast  Result Date: 08/18/2017 CLINICAL DATA:  Seizure, new, abnormal neuro exam, nontraumatic. No known history of seizures. Denise Mcbride is unable to contribute to history given recurrent state. EXAM: MRI HEAD WITHOUT CONTRAST TECHNIQUE: Multiplanar, multiecho pulse sequences of the brain and surrounding structures were obtained without intravenous contrast. COMPARISON:  CT head without contrast from the same day. FINDINGS: Brain: A focal area of remote hemorrhagic encephalomalacia is present in the left frontal operculum. 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 There is volume loss. No mass effect is present. This involves the insular cortex without involvement of temporal lobe. Dedicated imaging of the temporal lobes demonstrate symmetric size and signal of the hippocampal structures. No acute infarct or hemorrhage is present. There is slight ex vacuo dilation of the left lateral ventricle related to the area of encephalomalacia. The ventricles  are otherwise of normal size. No significant extra-axial fluid collection is present. The internal auditory canals are within normal limits bilaterally. The brainstem and cerebellum are normal. Vascular: Flow is present in the major intracranial arteries. Skull and upper cervical spine: The skull base is within normal limits. The craniocervical junction is normal. Upper cervical spine is within normal limits. Sinuses/Orbits: The paranasal sinuses and mastoid air cells are clear. The globes and orbits are within normal limits. IMPRESSION: 1. Focal area of remote encephalomalacia involving the left frontal operculum and insular cortex measuring 2.3 x 1.8 x 3.4 cm could certainly serve as a seizure focus. 2. No acute intracranial abnormality. Electronically Signed   By: San Morelle M.D.   On: 08/18/2017 08:36    (Echo, Carotid, EGD, Colonoscopy, ERCP)    Subjective:   Discharge Exam: Vitals:   08/21/17 0625 08/21/17 0757  BP: 126/72 (!) 122/93  Pulse: (!) 53 (!) 57  Resp: 14 15  Temp: 98 F (36.7 C) 98.2 F (36.8 C)  SpO2: 100% 100%   Vitals:   08/20/17 2032 08/21/17 0019 08/21/17 0625 08/21/17 0757  BP: 99/78 121/79 126/72 (!) 122/93  Pulse: 66 62 (!) 53 (!) 57  Resp: _0 Temp: 97.9 F (36.6 C) (!) 97.4 F (36.3 C) 98 F (36.7 C) 98.2 F (36.8 C)  TempSrc: Oral Oral Oral Oral  SpO2: 100% 100% 100% 100%  Weight:      Height:        General: Pt is alert, awake, not in acute distress Cardiovascular: RRR, S1/S2 +, no rubs, no gallops Respiratory: CTA bilaterally, no wheezing, no rhonchi Abdominal: Soft, NT, ND, bowel sounds + Extremities: no edema, no cyanosis    The results of significant diagnostics from this hospitalization (including imaging, microbiology, ancillary and laboratory) are listed below for reference.     Microbiology: No results found for this or any previous visit (from the past 240 hour(s)).   Labs: BNP (last 3 results) No results for  input(s): BNP in the last 8760 hours. Basic Metabolic Panel: Recent Labs  Lab 08/18/17 0404 08/18/17 1340 08/18/17 1620 08/19/17 1338 08/20/17 0517 08/21/17 0610  NA 136 138 139 138 140 143  K 3.2* 3.0* 3.7 3.0* 3.0* 3.2*  CL 104 104 107 103 104 106  CO2 24 23 20* _1 GLUCOSE 245* 213* 294* 348* 228* 169*  BUN 9 5* _2 CREATININE 0.59 0.58 0.59 0.95 0.72 0.72  CALCIUM 9.2 9.2 9.0 9.0 9.4 9.5  MG 1.8  --   --   --   --  1.7   Liver Function Tests: Recent Labs  Lab 08/17/17 2351  AST 51*  ALT 25  ALKPHOS 151*  BILITOT 1.6*  PROT 8.0  ALBUMIN 4.5   No results for input(s): LIPASE, AMYLASE in the last 168 hours. No results for input(s): AMMONIA in the last 168 hours. CBC: Recent Labs  Lab 08/17/17 2351 08/19/17 1338 08/20/17 0517  WBC 6.0 5.0 4.2  NEUTROABS 3.1  --  2.1  HGB 15.9* 14.4 14.0  HCT 45.8 40.6 39.9  MCV 82.7 80.6 81.6  PLT 182 162 146*   Cardiac Enzymes: No results for input(s): CKTOTAL, CKMB, CKMBINDEX, TROPONINI in the last 168 hours. BNP: Invalid input(s): POCBNP CBG: Recent Labs  Lab 08/20/17 1138 08/20/17 1534 08/20/17 1636 08/20/17 2126 08/21/17 0623  GLUCAP 136* 126* 134* 217* 174*   D-Dimer No results for input(s): DDIMER in the last 72 hours. Hgb A1c No results for input(s): HGBA1C in the last 72 hours. Lipid Profile No results for input(s): CHOL, HDL, LDLCALC, TRIG, CHOLHDL, LDLDIRECT in the last 72 hours. Thyroid function studies No results for input(s): TSH, T4TOTAL, T3FREE, THYROIDAB in the last 72 hours.  Invalid input(s): FREET3 Anemia work up No results for input(s): VITAMINB12, FOLATE, FERRITIN, TIBC, IRON, RETICCTPCT in the last 72 hours. Urinalysis    Component Value Date/Time   COLORURINE STRAW (A) 08/18/2017 0109   APPEARANCEUR CLEAR 08/18/2017 0109   LABSPEC 1.024 08/18/2017 0109   PHURINE 6.0 08/18/2017 0109   GLUCOSEU >=500 (A) 08/18/2017 0109   HGBUR SMALL (A) 08/18/2017 0109   BILIRUBINUR  NEGATIVE 08/18/2017 0109   KETONESUR NEGATIVE 08/18/2017 0109   PROTEINUR 100 (A) 08/18/2017 0109   NITRITE NEGATIVE 08/18/2017 0109   LEUKOCYTESUR NEGATIVE 08/18/2017 0109   Sepsis Labs Invalid input(s): PROCALCITONIN,  WBC,  LACTICIDVEN Microbiology No results found for this or any previous visit (from the past 240 hour(s)).   Time coordinating discharge: 34 minutes  SIGNED:   Georgette Shell, MD  [facility] 08/21/2017, 8:37 AM Pager   If 7PM-7AM, please contact night-coverage [email or web address] Password [email or web address]

## 2017-08-21 NOTE — Care Management Note (Signed)
Case Management Note  Patient Details  Name: Denise Mcbride MRN: [id number] Date of Birth: 1960-07-31  Subjective/Objective:                    Action/Plan: Pt discharging home with self care. PT recommending HH services. Pt is from [place] and will f/u with her PCP for these services. CM ordered her a walker and it is at bedside.  Pt was on Eliquis at home. Pt unsure of her copay. CM provided her the 10 day co pay card.  Pt has transportation home.    Expected Discharge Date:  08/21/17               Expected Discharge Plan:  Home/Self Care  In-House Referral:     Discharge planning Services  CM Consult  Post Acute Care Choice:  Durable Medical Equipment Choice offered to:     DME Arranged:  Walker rolling DME Agency:  [facility]  HH Arranged:    [facility] Agency:     Status of Service:  Completed, signed off  If discussed at Long Length of Stay Meetings, dates discussed:    Additional Comments:  Kermit Balo, RN 08/21/2017, 10:48 AM

## 2019-03-15 IMAGING — MR MR HEAD W/O CM
9 of 11 series · 34 of 48 positions shown · non-contrast
Comparison: CT head without contrast from the same day.

CLINICAL DATA: Seizure, new, abnormal neuro exam, nontraumatic. No
known history of seizures. Patient is unable to contribute to
history given recurrent state.

EXAM:
MRI HEAD WITHOUT CONTRAST
TECHNIQUE: Multiplanar, multiecho pulse sequences of the brain and surrounding
structures were obtained without intravenous contrast.

[Series 3: DWI · axial · 3.0mm · 0.94mm/px · z∈[-73,+71]mm · 9 of 100 slices shown (1 of 2)]
[im 1/100]
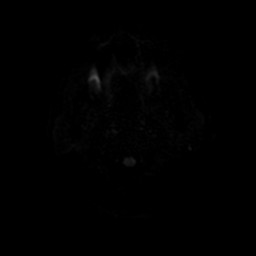
[im 13/100]
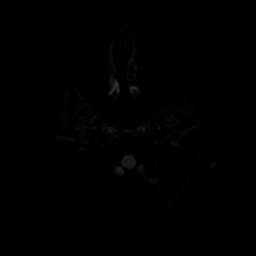
[im 25/100]
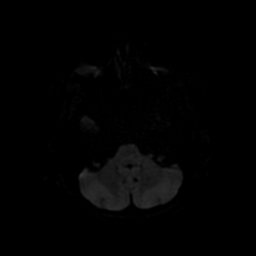
[im 38/100]
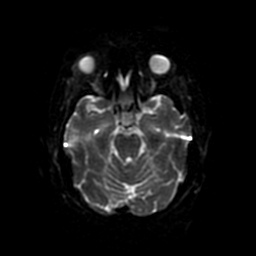
[im 50/100]
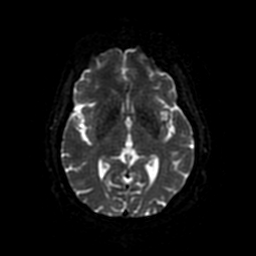
[im 62/100]
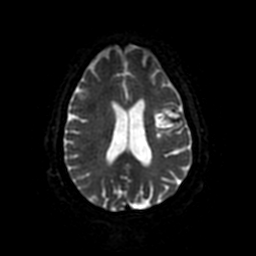
[im 75/100]
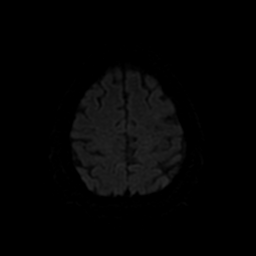
[im 87/100]
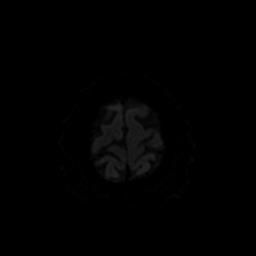
[im 100/100]
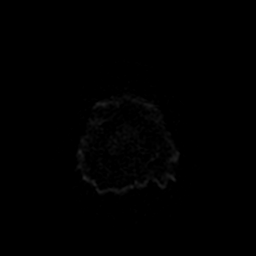

[Series 4: DWI · coronal · 4.0mm · 0.94mm/px · 6 of 72 slices shown (2 of 2)]
[im 1/72]
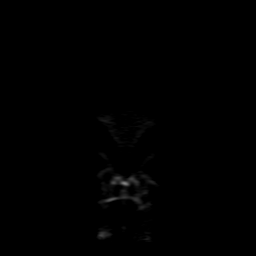
[im 15/72]
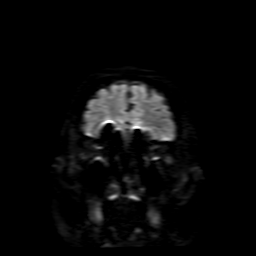
[im 29/72]
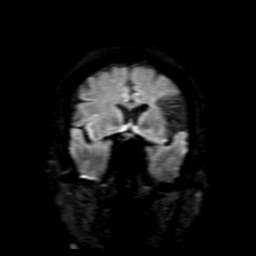
[im 43/72]
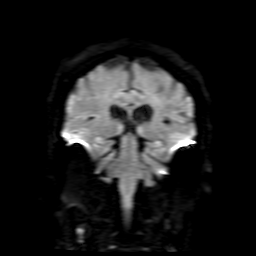
[im 57/72]
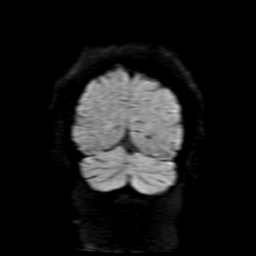
[im 72/72]
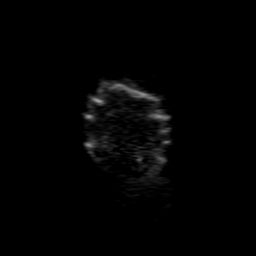

[Series 5: FLAIR · sagittal · 5.0mm · 0.47mm/px · 2 of 23 slices shown (1 of 2)]
[im 1/23]
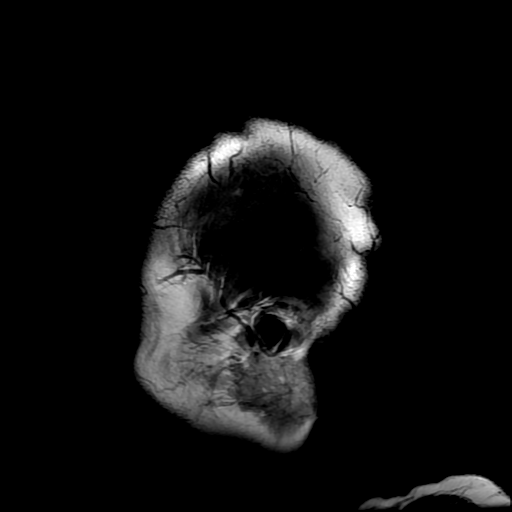
[im 23/23]
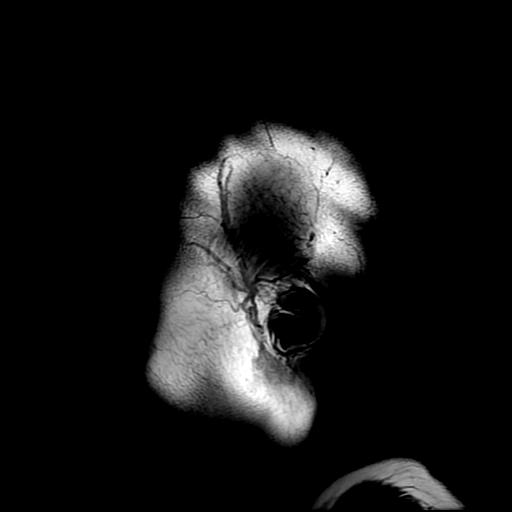

[Series 7: T2 · axial · 5.0mm · 0.47mm/px · z∈[-93,+69]mm · 3 of 29 slices shown (1 of 3)]
[im 1/29]
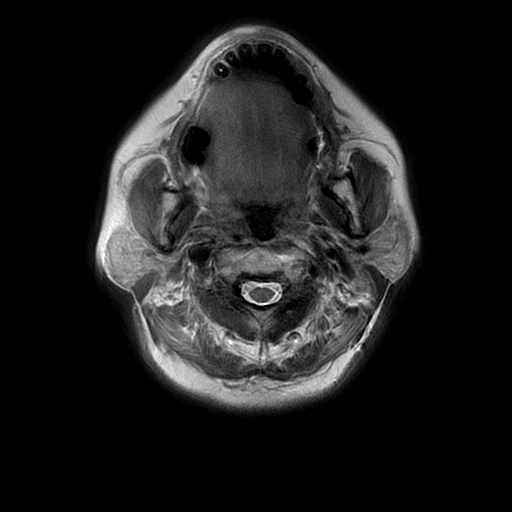
[im 15/29]
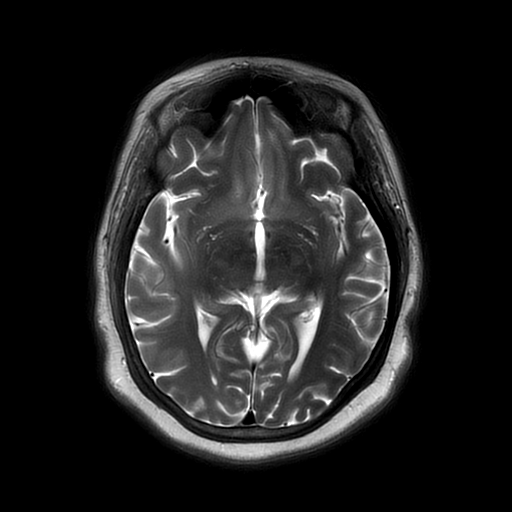
[im 29/29]
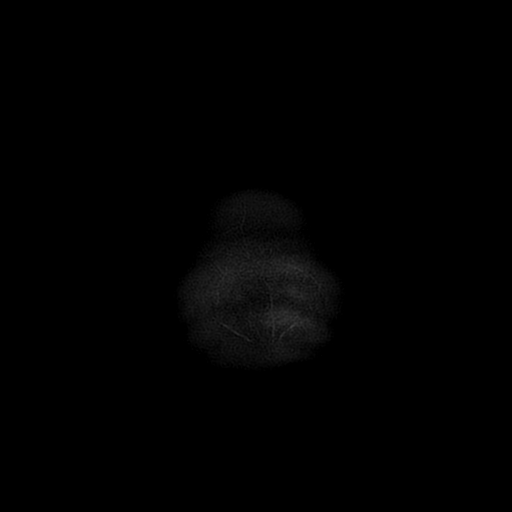

[Series 8: FLAIR · axial · 3.0mm · 0.45mm/px · z∈[-92,+70]mm · 3 of 29 slices shown (2 of 2)]
[im 1/29]
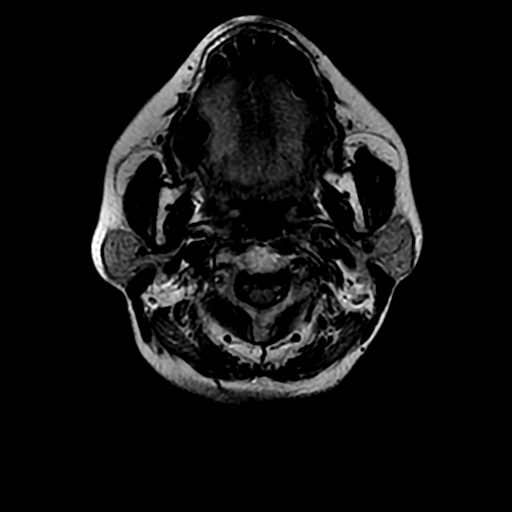
[im 15/29]
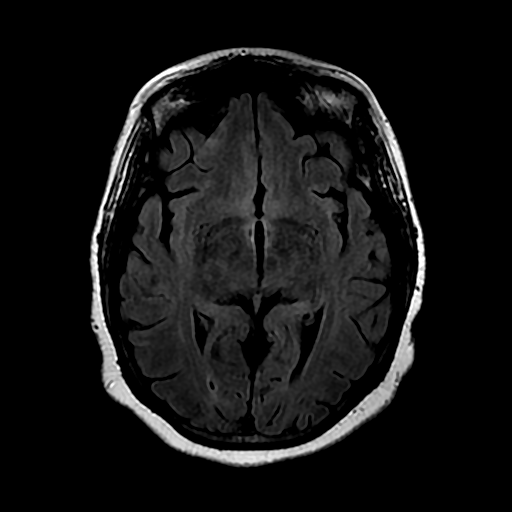
[im 29/29]
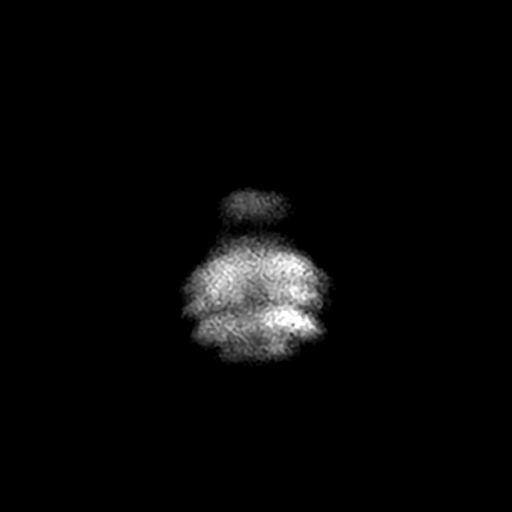

[Series 12: T2 · oblique · 3.5mm · 0.35mm/px · 2 of 28 slices shown (2 of 3)]
[im 1/28]
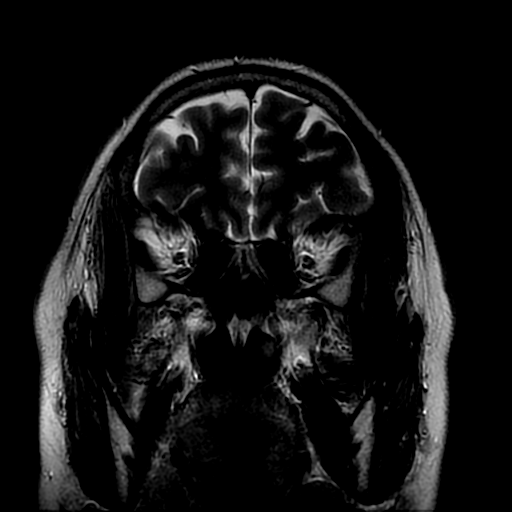
[im 28/28]
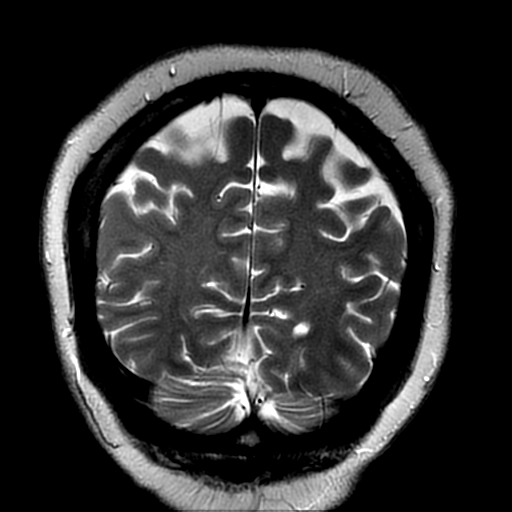

[Series 13: T2 · coronal · 5.0mm · 0.39mm/px · 2 of 28 slices shown (3 of 3)]
[im 1/28]
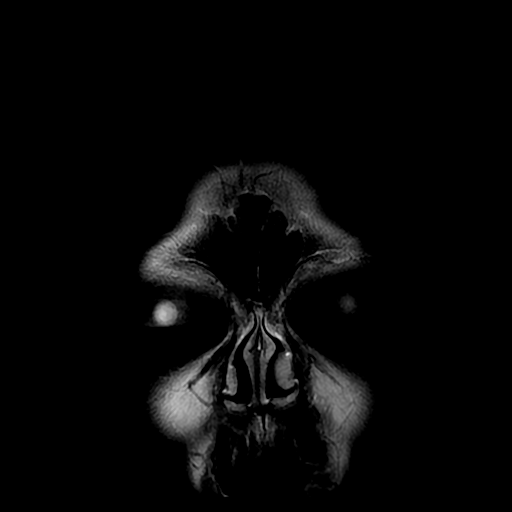
[im 28/28]
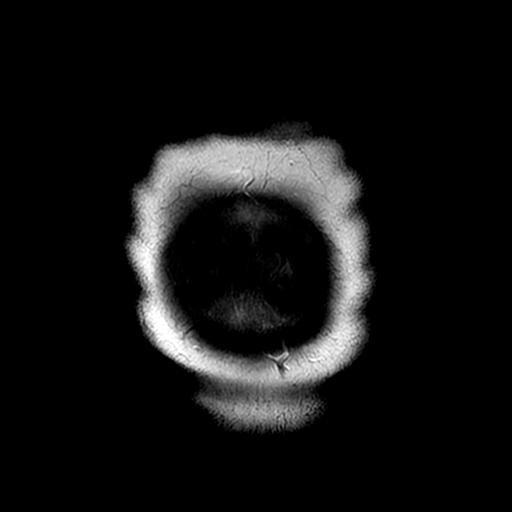

[Series 350: ADC · axial · 3.0mm · 0.94mm/px · z∈[-73,+71]mm · 4 of 50 slices shown (1 of 2)]
[im 1/50]
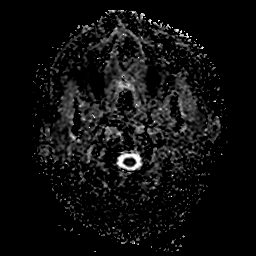
[im 17/50]
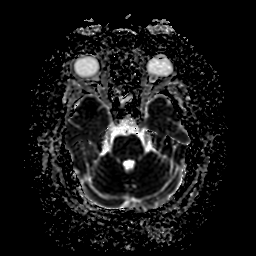
[im 33/50]
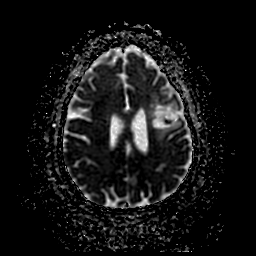
[im 50/50]
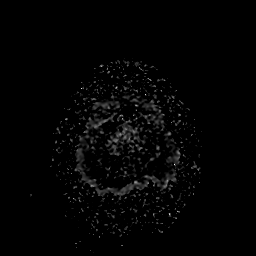

[Series 450: ADC · coronal · 4.0mm · 0.94mm/px · 3 of 36 slices shown (2 of 2)]
[im 1/36]
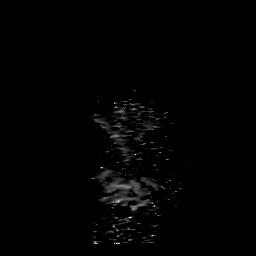
[im 18/36]
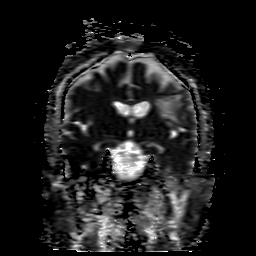
[im 36/36]
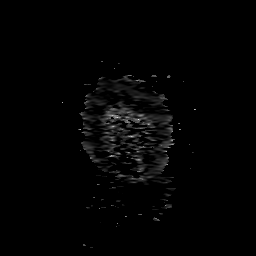

[34 of 48 positions shown; findings below may reference images not displayed]

FINDINGS: Brain: A focal area of remote hemorrhagic encephalomalacia is
present in the left frontal operculum. T1 shortening is consistent
with cortical laminar necrosis. There is volume loss. No mass effect
is present. This involves the insular cortex without involvement of
temporal lobe.

Dedicated imaging of the temporal lobes demonstrate symmetric size
and signal of the hippocampal structures.

No acute infarct or hemorrhage is present. There is slight ex vacuo
dilation of the left lateral ventricle related to the area of
encephalomalacia. The ventricles are otherwise of normal size. No
significant extra-axial fluid collection is present.

The internal auditory canals are within normal limits bilaterally.
The brainstem and cerebellum are normal.

Vascular: Flow is present in the major intracranial arteries.

Skull and upper cervical spine: The skull base is within normal
limits. The craniocervical junction is normal. Upper cervical spine
is within normal limits.

Sinuses/Orbits: The paranasal sinuses and mastoid air cells are
clear. The globes and orbits are within normal limits.
IMPRESSION: 1. Focal area of remote encephalomalacia involving the left frontal
operculum and insular cortex measuring 2.3 x 1.8 x 3.4 cm could
certainly serve as a seizure focus.
2. No acute intracranial abnormality.
